# Patient Record
Sex: Male | Born: 1975
Health system: Southern US, Community
[De-identification: ages and names within clinical notes are randomized; demographics above are authoritative.]

## PROBLEM LIST (undated history)

## (undated) DIAGNOSIS — Z789 Other specified health status: Secondary | ICD-10-CM

## (undated) HISTORY — PX: CERVICAL DISC SURGERY: SHX588

## (undated) HISTORY — PX: ANKLE SURGERY: SHX546

## (undated) HISTORY — PX: MOUTH SURGERY: SHX715

---

## 2002-09-24 ENCOUNTER — Encounter: Payer: Self-pay | Admitting: Emergency Medicine

## 2002-09-24 ENCOUNTER — Emergency Department (HOSPITAL_COMMUNITY): Admission: EM | Admit: 2002-09-24 | Discharge: 2002-09-24 | Payer: Self-pay | Admitting: Emergency Medicine

## 2003-12-04 ENCOUNTER — Ambulatory Visit (HOSPITAL_COMMUNITY): Admission: RE | Admit: 2003-12-04 | Discharge: 2003-12-04 | Payer: Self-pay | Admitting: Family Medicine

## 2006-10-18 ENCOUNTER — Ambulatory Visit (HOSPITAL_COMMUNITY): Admission: RE | Admit: 2006-10-18 | Discharge: 2006-10-19 | Payer: Self-pay | Admitting: Neurosurgery

## 2008-04-12 ENCOUNTER — Emergency Department (HOSPITAL_COMMUNITY): Admission: EM | Admit: 2008-04-12 | Discharge: 2008-04-13 | Payer: Self-pay | Admitting: Emergency Medicine

## 2008-07-14 IMAGING — CR DG CERVICAL SPINE 2 OR 3 VIEWS
1 series · 1 of 1 positions shown · non-contrast
Comparison: none

CLINICAL DATA: C6-7 ACDF.  
 CERVICAL SPINE ? 2 VIEW:

[view not recorded]
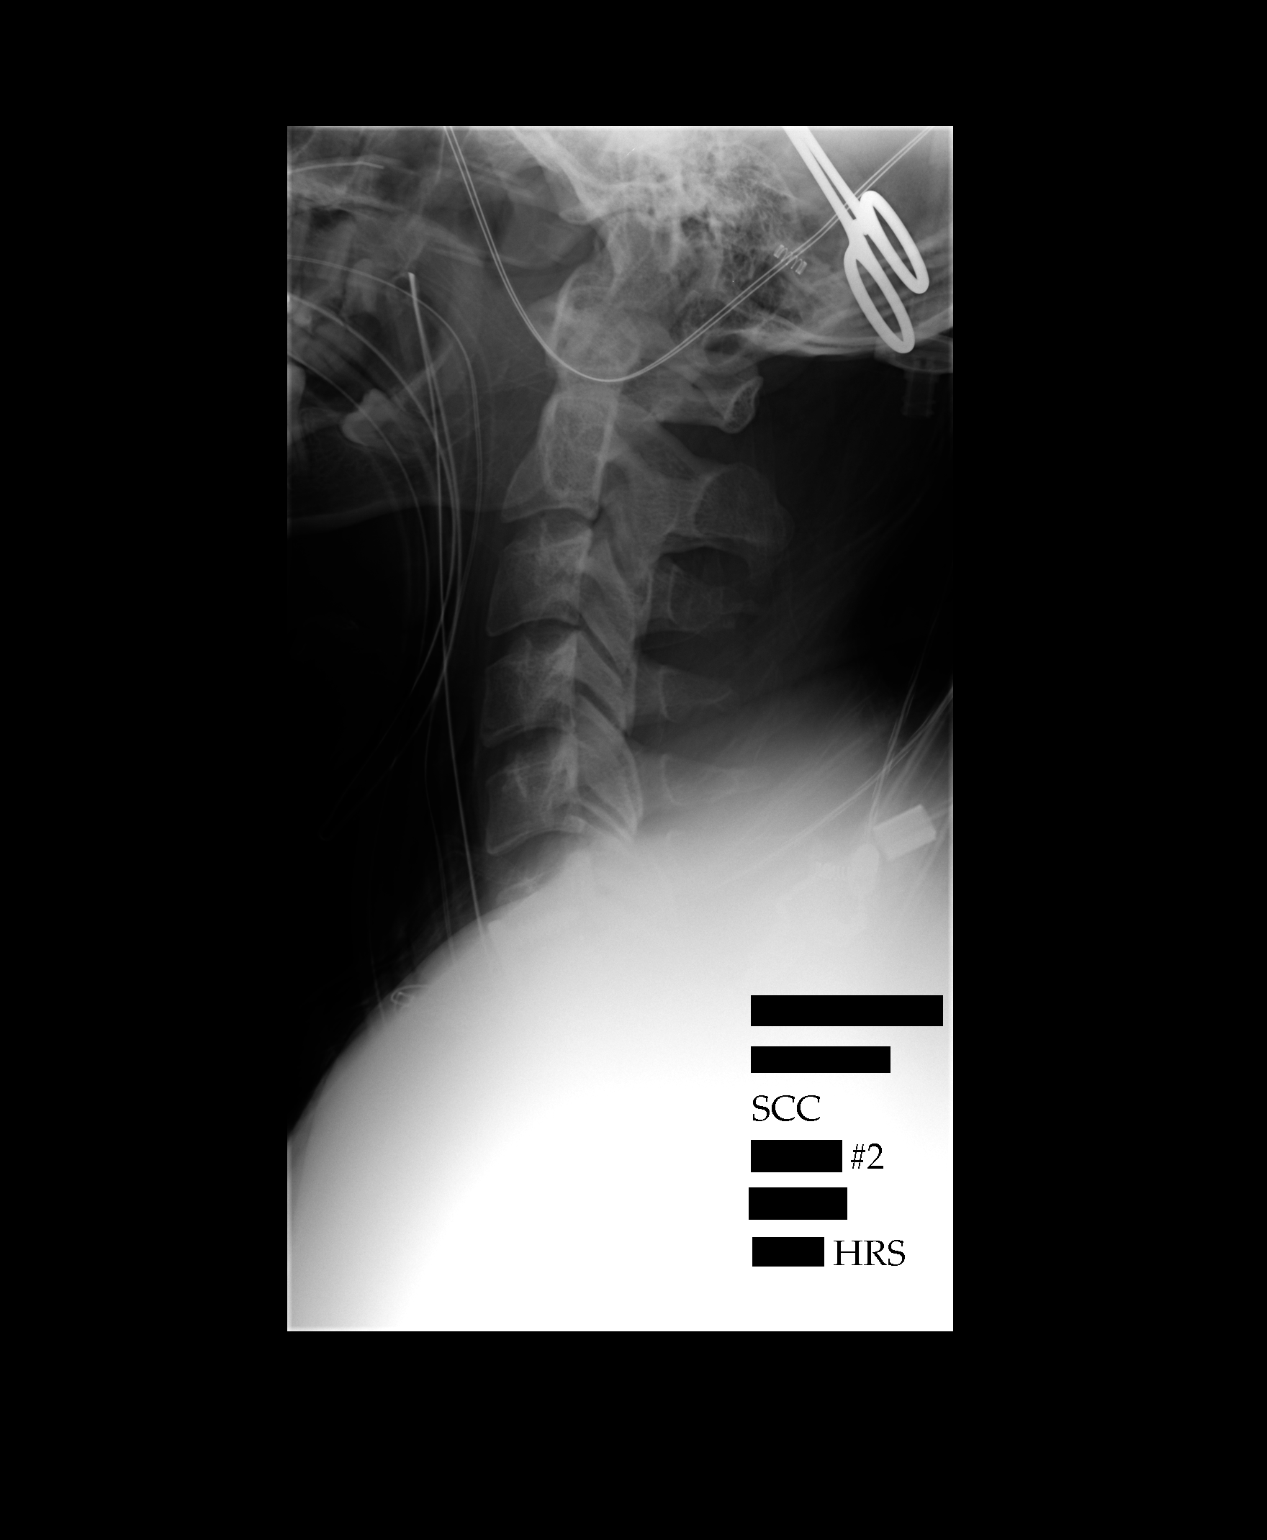

[1 of 1 positions shown; findings below may reference images not displayed]

FINDINGS: An initial film shows a needle marking the anterior disc space at C4-5.  
 A 2nd film shows anterior cervical discectomy and fusion at C6-7.  One can appreciate an anterior plate with screws in C6.  Otherwise, detail is limited by shoulder density.
IMPRESSION: As discussed above.

## 2010-09-07 NOTE — Op Note (Signed)
NAMEBELDON, NOWLING NO.:  [id number]   MEDICAL RECORD NO.:  [id number]          PATIENT TYPE:  OIB   LOCATION:  3172                         FACILITY:  [facility]   PHYSICIAN:  Cristi Loron, M.D.DATE OF BIRTH:  December 08, 1975   DATE OF PROCEDURE:  10/18/2006  DATE OF DISCHARGE:                               OPERATIVE REPORT   BRIEF HISTORY:  The patient is 35 year old white male who suffered from  neck and right arm pain consistent with a right C7 radiculopathy.  He  has failed medical management and I discussed various treatment options  with him including surgery.  The patient has weighed the risks, benefits  and alternatives of surgery and decided to proceed with C6-7 anterior  cervical diskectomy, fusion plating.   PREOPERATIVE DIAGNOSIS:  C6-7 herniated nucleus pulposus, spinal  stenosis, cervical radiculopathy, cervicalgia.   POSTOPERATIVE DIAGNOSIS:  C6-7 herniated nucleus pulposus, spinal  stenosis, cervical radiculopathy, cervicalgia.   PROCEDURE:  C6-7 extensive anterior cervical diskectomy and  decompression; C6-7 anterior interbody local autograft arthrodesis;  insertion of C6-7 interbody prosthesis (Alphatec PEEK interbody  prosthesis); anterior cervical plating C6-7 with Codman slim lock  titanium plate and screws.   SURGEON:  Dr. Delma Officer.   ASSISTANT:  Dr. Barnett Abu.   ANESTHESIA:  Endotracheal.   ESTIMATED BLOOD LOSS:  50 mL.   SPECIMENS:  None.   DRAINS:  None.   COMPLICATIONS:  None.   DESCRIPTION OF PROCEDURE:  The patient is brought to operating room by  anesthesia team.  General endotracheal anesthesia was induced.  The  patient remained in supine position.  A roll was placed under shoulders  to place the neck in slight extension.  His anterior cervical region was  then prepared with Betadine scrub and Betadine solution.  Sterile drapes  were applied.  I then injected the area to be incised with Marcaine with  epinephrine solution.  Used scalpel to make a transverse incision in the  patient's left anterior neck.  I used a Metzenbaum scissors to divide  the platysma muscle and then to dissect medial to sternocleidomastoid  muscle, jugular vein and carotid artery.  I carefully dissected down  towards the anterior cervical spine carefully identifying esophagus  retracting it medially.  I then used Kitner swabs to clear soft tissue  from the anterior cervical spine and then inserted a bent spinal needle  in the upper exposed intervertebral disk space.  We then obtained  intraoperative radiograph to confirm our location.   We then used electrocautery to detach the medial border of the longus  colli muscle bilaterally from C6-7 intervertebral disk space.  We then  inserted a Caspar self-retaining retractor for exposure.  We then  incised C6-7 intervertebral disk with 15 scalpel and then used the  pituitary forceps and Carlen curettes to perform a partial  intervertebral diskectomy.  We inserted distraction screws C6-7  distracted interspace and then I used a high-speed drill to decorticate  the vertebral endplates C6-7, drill away the remainder of C6-7  intervertebral disk, drill away some posterior spondylosis and then to  thin out  the posterior longitudinal ligament.  We then incised ligament  with arachnoid knife and removed it with Kerrison punch undercutting the  vertebral endplates C6-7 and then performed foraminotomy about the  bilateral C7 nerve root.  Of note, we did encountered expected herniated  C6-7 on the right compressing the right C7 nerve root.  At this point we  had a good decompression of thecal sac and the bilateral C7 nerve roots.   We now turned attention to arthrodesis.  We used the trial spacers and  determined to use a 8-mm medium Alphatec PEEK interbody prosthesis.  We  prefilled this prosthesis with a combination of local morselized  autograft bone we obtained during  decompression as well as Vitoss bone  graft extender.  We inserted the filled prosthesis into distracted  interspace and then removed distraction screws.  There was good snug fit  of prosthesis in the interspace.   We now turned attention anterior spinal instrumentation, used a high-  speed drill to remove some ventral spondylosis from the vertebral  endplates at C6-7 so that plate would lay down flat.  We selected  appropriate length slim lock anterior cervical plate and laid it along  the anterior aspect of vertebral bodies C6 and C7.  We then drilled two  14 mm holes C6, two at C7 and then secured the plate to the vertebral  bodies by placing two 14 mm self-tapping screws at C6, two at C7.  We  then obtained intraoperative radiograph. There was very limited  visualization of the plate and screws and interbody prosthesis because  of the patient's shoulders but they looked good in vivo.  We therefore  secured the screws and plate by locking each cam completing the  instrumentation.   We then irrigated the wound out bacitracin solution, obtained stringent  hemostasis using bipolar cautery, we inspected esophagus for any damage.  None was apparent.  We then reapproximated the patient's platysma muscle  with interrupted 3-0 Vicryl suture, subcutaneous tissue with interrupted  3-0 Vicryl suture and the skin with Steri-Strips and Benzoin.  The wound  was then coated bacitracin ointment, sterile dressing applied.  The  drapes were removed.  The patient was subsequently extubated by  anesthesia team and transported to post anesthesia care unit in stable  condition.  All sponge, instrument and needle counts correct end this  case.      Cristi Loron, M.D.  Electronically Signed     JDJ/MEDQ  D:  10/18/2006  T:  10/19/2006  Job:  [id number]

## 2011-02-09 LAB — CBC
HCT: 43.9
MCHC: 34.5
MCV: 88.7
Platelets: 245
RDW: 13

## 2011-08-19 ENCOUNTER — Other Ambulatory Visit: Payer: Self-pay | Admitting: Family Medicine

## 2011-08-19 DIAGNOSIS — M5416 Radiculopathy, lumbar region: Secondary | ICD-10-CM

## 2011-08-22 ENCOUNTER — Ambulatory Visit
Admission: RE | Admit: 2011-08-22 | Discharge: 2011-08-22 | Disposition: A | Discharge status: Home / Self Care | Payer: 59 | Source: Ambulatory Visit | Attending: Family Medicine | Admitting: Family Medicine

## 2011-08-22 DIAGNOSIS — M5416 Radiculopathy, lumbar region: Secondary | ICD-10-CM

## 2011-10-14 ENCOUNTER — Other Ambulatory Visit: Payer: Self-pay | Admitting: Neurosurgery

## 2011-10-14 ENCOUNTER — Encounter (HOSPITAL_COMMUNITY): Payer: Self-pay | Admitting: Pharmacy Technician

## 2011-10-17 NOTE — Anesthesia Preprocedure Evaluation (Addendum)
Anesthesia Evaluation  Patient identified by MRN, date of birth, ID band Patient awake    Reviewed: Allergy & Precautions, H&P , NPO status , Patient's Chart, lab work & pertinent test results  Airway Mallampati: II      Dental  (+) Teeth Intact   Pulmonary neg pulmonary ROS,  breath sounds clear to auscultation        Cardiovascular Rhythm:Regular Rate:Normal     Neuro/Psych negative neurological ROS     GI/Hepatic negative GI ROS, Neg liver ROS,   Endo/Other  negative endocrine ROS  Renal/GU negative Renal ROS     Musculoskeletal negative musculoskeletal ROS (+)   Abdominal   Peds  Hematology negative hematology ROS (+)   Anesthesia Other Findings   Reproductive/Obstetrics                          Anesthesia Physical Anesthesia Plan  ASA: II  Anesthesia Plan: General   Post-op Pain Management:    Induction: Intravenous  Airway Management Planned: Oral ETT  Additional Equipment:   Intra-op Plan:   Post-operative Plan: Extubation in OR  Informed Consent: I have reviewed the patients History and Physical, chart, labs and discussed the procedure including the risks, benefits and alternatives for the proposed anesthesia with the patient or authorized representative who has indicated his/her understanding and acceptance.   Dental advisory given  Plan Discussed with: CRNA and Anesthesiologist  Anesthesia Plan Comments:         Anesthesia Quick Evaluation

## 2011-10-18 ENCOUNTER — Encounter (HOSPITAL_COMMUNITY): Payer: Self-pay

## 2011-10-18 ENCOUNTER — Encounter (HOSPITAL_COMMUNITY)
Admission: RE | Admit: 2011-10-18 | Discharge: 2011-10-18 | Disposition: A | Discharge status: Home / Self Care | Payer: 59 | Source: Ambulatory Visit | Attending: Neurosurgery | Admitting: Neurosurgery

## 2011-10-18 HISTORY — DX: Other specified health status: Z78.9

## 2011-10-18 LAB — CBC
Hemoglobin: 15.7 g/dL (ref 13.0–17.0)
MCH: 29.4 pg (ref 26.0–34.0)
MCHC: 34.1 g/dL (ref 30.0–36.0)
MCV: 86.1 fL (ref 78.0–100.0)

## 2011-10-18 LAB — COMPREHENSIVE METABOLIC PANEL
Albumin: 4 g/dL (ref 3.5–5.2)
Alkaline Phosphatase: 67 U/L (ref 39–117)
BUN: 10 mg/dL (ref 6–23)
Chloride: 101 mEq/L (ref 96–112)
GFR calc Af Amer: >90 mL/min (ref >90)
Glucose, Bld: 94 mg/dL (ref 70–99)
Potassium: 4.2 mEq/L (ref 3.5–5.1)
Total Bilirubin: 0.4 mg/dL (ref 0.3–1.2)

## 2011-10-18 LAB — SURGICAL PCR SCREEN: MRSA, PCR: NEGATIVE

## 2011-10-18 NOTE — Pre-Procedure Instructions (Signed)
20 [name]  10/18/2011   Your procedure is scheduled on:  10/20/11  Report to [facility] at 5:30 AM (per MD office).  Call this number if you have problems the morning of surgery: [phone]   Remember: Discontinue Aspirin,Coumadin, Effient, Plavix and herbal medications.   Do not eat food or drink fluids:After Midnight.    Take these medicines the morning of surgery with A SIP OF WATER: pain pill, Valium   Do not wear jewelry, make-up or nail polish.  Do not wear lotions, powders, or perfumes. You may wear deodorant.  Do not shave 48 hours prior to surgery. Men may shave face and neck.  Do not bring valuables to the hospital.  Contacts, dentures or bridgework may not be worn into surgery.  Leave suitcase in the car. After surgery it may be brought to your room.  For patients admitted to the hospital, checkout time is 11:00 AM the day of discharge.   Patients discharged the day of surgery will not be allowed to drive home.    Special Instructions: CHG Shower Use Special Wash: 1/2 bottle night before surgery and 1/2 bottle morning of surgery. and N/A   Please read over the following fact sheets that you were given: Pain Booklet, Coughing and Deep Breathing, MRSA Information and Surgical Site Infection Prevention

## 2011-10-19 MED ORDER — CEFAZOLIN SODIUM-DEXTROSE 2-3 GM-% IV SOLR
2.0000 g | INTRAVENOUS | Status: AC
  Administered 2011-10-20: 2 g via INTRAVENOUS
  Filled 2011-10-19: qty 50

## 2011-10-20 ENCOUNTER — Encounter (HOSPITAL_COMMUNITY): Payer: Self-pay | Admitting: Anesthesiology

## 2011-10-20 ENCOUNTER — Encounter (HOSPITAL_COMMUNITY): Payer: Self-pay | Admitting: Surgery

## 2011-10-20 ENCOUNTER — Encounter (HOSPITAL_COMMUNITY)
Admission: RE | Disposition: A | Discharge status: Home / Self Care | Payer: Self-pay | Source: Ambulatory Visit | Attending: Neurosurgery

## 2011-10-20 ENCOUNTER — Ambulatory Visit (HOSPITAL_COMMUNITY): Payer: 59 | Admitting: Anesthesiology

## 2011-10-20 ENCOUNTER — Ambulatory Visit (HOSPITAL_COMMUNITY)
Admission: RE | Admit: 2011-10-20 | Discharge: 2011-10-20 | Disposition: A | Discharge status: Home / Self Care | Payer: 59 | Source: Ambulatory Visit | Attending: Neurosurgery | Admitting: Neurosurgery

## 2011-10-20 ENCOUNTER — Ambulatory Visit (HOSPITAL_COMMUNITY): Payer: 59

## 2011-10-20 DIAGNOSIS — M5126 Other intervertebral disc displacement, lumbar region: Secondary | ICD-10-CM

## 2011-10-20 DIAGNOSIS — Z01812 Encounter for preprocedural laboratory examination: Secondary | ICD-10-CM | POA: Insufficient documentation

## 2011-10-20 HISTORY — PX: LUMBAR LAMINECTOMY/DECOMPRESSION MICRODISCECTOMY: SHX5026

## 2011-10-20 SURGERY — LUMBAR LAMINECTOMY/DECOMPRESSION MICRODISCECTOMY 1 LEVEL
Anesthesia: General | Laterality: Left | Wound class: Clean

## 2011-10-20 MED ORDER — DIAZEPAM 5 MG PO TABS
5.0000 mg | ORAL_TABLET | Freq: Four times a day (QID) | ORAL | Status: DC | PRN
  Administered 2011-10-20: 5 mg via ORAL
  Filled 2011-10-20: qty 1

## 2011-10-20 MED ORDER — ADULT MULTIVITAMIN W/MINERALS CH: 1.0000 | ORAL_TABLET | Freq: Every day | ORAL | Status: DC

## 2011-10-20 MED ORDER — MIDAZOLAM HCL 5 MG/5ML IJ SOLN
INTRAMUSCULAR | Status: DC | PRN
  Administered 2011-10-20: 2 mg via INTRAVENOUS

## 2011-10-20 MED ORDER — HYDROMORPHONE HCL PF 1 MG/ML IJ SOLN
0.2500 mg | INTRAMUSCULAR | Status: DC | PRN
  Administered 2011-10-20: 0.5 mg via INTRAVENOUS

## 2011-10-20 MED ORDER — ROCURONIUM BROMIDE 100 MG/10ML IV SOLN
INTRAVENOUS | Status: DC | PRN
  Administered 2011-10-20: 50 mg via INTRAVENOUS

## 2011-10-20 MED ORDER — BUPIVACAINE-EPINEPHRINE PF 0.5-1:200000 % IJ SOLN
INTRAMUSCULAR | Status: DC | PRN
  Administered 2011-10-20: 10 mL

## 2011-10-20 MED ORDER — ACETAMINOPHEN 325 MG PO TABS: 650.0000 mg | ORAL_TABLET | ORAL | Status: DC | PRN

## 2011-10-20 MED ORDER — DOCUSATE SODIUM 100 MG PO CAPS: 100.0000 mg | ORAL_CAPSULE | Freq: Two times a day (BID) | ORAL | Status: DC

## 2011-10-20 MED ORDER — GLYCOPYRROLATE 0.2 MG/ML IJ SOLN
INTRAMUSCULAR | Status: DC | PRN
  Administered 2011-10-20: .4 mg via INTRAVENOUS

## 2011-10-20 MED ORDER — MORPHINE SULFATE 2 MG/ML IJ SOLN: 1.0000 mg | INTRAMUSCULAR | Status: DC | PRN

## 2011-10-20 MED ORDER — 0.9 % SODIUM CHLORIDE (POUR BTL) OPTIME
TOPICAL | Status: DC | PRN
  Administered 2011-10-20: 1000 mL

## 2011-10-20 MED ORDER — DIAZEPAM 5 MG PO TABS: 5.0000 mg | ORAL_TABLET | Freq: Four times a day (QID) | ORAL | Status: AC | PRN

## 2011-10-20 MED ORDER — LACTATED RINGERS IV SOLN: INTRAVENOUS | Status: DC

## 2011-10-20 MED ORDER — SODIUM CHLORIDE 0.9 % IR SOLN
Status: DC | PRN
  Administered 2011-10-20: 09:00:00

## 2011-10-20 MED ORDER — ONDANSETRON HCL 4 MG/2ML IJ SOLN: 4.0000 mg | INTRAMUSCULAR | Status: DC | PRN

## 2011-10-20 MED ORDER — BACITRACIN ZINC 500 UNIT/GM EX OINT
TOPICAL_OINTMENT | CUTANEOUS | Status: DC | PRN
  Administered 2011-10-20: 1 via TOPICAL

## 2011-10-20 MED ORDER — DEXAMETHASONE SODIUM PHOSPHATE 4 MG/ML IJ SOLN
INTRAMUSCULAR | Status: DC | PRN
  Administered 2011-10-20: 10 mg via INTRAVENOUS

## 2011-10-20 MED ORDER — LACTATED RINGERS IV SOLN
INTRAVENOUS | Status: DC | PRN
  Administered 2011-10-20 (×2): via INTRAVENOUS

## 2011-10-20 MED ORDER — BACITRACIN 50000 UNITS IM SOLR
INTRAMUSCULAR | Status: AC
  Filled 2011-10-20: qty 1

## 2011-10-20 MED ORDER — SODIUM CHLORIDE 0.9 % IV SOLN
INTRAVENOUS | Status: AC
  Filled 2011-10-20: qty 500

## 2011-10-20 MED ORDER — ONDANSETRON HCL 4 MG/2ML IJ SOLN
INTRAMUSCULAR | Status: DC | PRN
  Administered 2011-10-20: 4 mg via INTRAVENOUS

## 2011-10-20 MED ORDER — PROPOFOL 10 MG/ML IV BOLUS
INTRAVENOUS | Status: DC | PRN
  Administered 2011-10-20: 200 mg via INTRAVENOUS

## 2011-10-20 MED ORDER — HYDROMORPHONE HCL PF 1 MG/ML IJ SOLN
INTRAMUSCULAR | Status: AC
  Administered 2011-10-20: 0.5 mg via INTRAVENOUS
  Filled 2011-10-20: qty 1

## 2011-10-20 MED ORDER — OXYCODONE-ACETAMINOPHEN 5-325 MG PO TABS: 1.0000 | ORAL_TABLET | ORAL | Status: AC | PRN

## 2011-10-20 MED ORDER — MENTHOL 3 MG MT LOZG: 1.0000 | LOZENGE | OROMUCOSAL | Status: DC | PRN

## 2011-10-20 MED ORDER — ZOLPIDEM TARTRATE 5 MG PO TABS: 10.0000 mg | ORAL_TABLET | Freq: Every evening | ORAL | Status: DC | PRN

## 2011-10-20 MED ORDER — CEFAZOLIN SODIUM-DEXTROSE 2-3 GM-% IV SOLR
2.0000 g | Freq: Three times a day (TID) | INTRAVENOUS | Status: DC
  Administered 2011-10-20: 2 g via INTRAVENOUS
  Filled 2011-10-20 (×2): qty 50

## 2011-10-20 MED ORDER — NEOSTIGMINE METHYLSULFATE 1 MG/ML IJ SOLN
INTRAMUSCULAR | Status: DC | PRN
  Administered 2011-10-20: 3 mg via INTRAVENOUS

## 2011-10-20 MED ORDER — LIDOCAINE HCL (CARDIAC) 20 MG/ML IV SOLN
INTRAVENOUS | Status: DC | PRN
  Administered 2011-10-20: 100 mg via INTRAVENOUS

## 2011-10-20 MED ORDER — HEMOSTATIC AGENTS (NO CHARGE) OPTIME
TOPICAL | Status: DC | PRN
  Administered 2011-10-20: 1 via TOPICAL

## 2011-10-20 MED ORDER — ACETAMINOPHEN 650 MG RE SUPP: 650.0000 mg | RECTAL | Status: DC | PRN

## 2011-10-20 MED ORDER — PHENOL 1.4 % MT LIQD: 1.0000 | OROMUCOSAL | Status: DC | PRN

## 2011-10-20 MED ORDER — DSS 100 MG PO CAPS: 100.0000 mg | ORAL_CAPSULE | Freq: Two times a day (BID) | ORAL | Status: AC

## 2011-10-20 MED ORDER — FENTANYL CITRATE 0.05 MG/ML IJ SOLN
INTRAMUSCULAR | Status: DC | PRN
  Administered 2011-10-20: 150 ug via INTRAVENOUS

## 2011-10-20 MED ORDER — HYDROMORPHONE HCL PF 1 MG/ML IJ SOLN: 0.2500 mg | INTRAMUSCULAR | Status: DC | PRN

## 2011-10-20 MED ORDER — OXYCODONE-ACETAMINOPHEN 5-325 MG PO TABS
1.0000 | ORAL_TABLET | ORAL | Status: DC | PRN
  Administered 2011-10-20 (×2): 2 via ORAL
  Filled 2011-10-20 (×2): qty 2

## 2011-10-20 MED ORDER — HYDROCODONE-ACETAMINOPHEN 5-325 MG PO TABS: 1.0000 | ORAL_TABLET | ORAL | Status: DC | PRN

## 2011-10-20 MED ORDER — THROMBIN 5000 UNITS EX SOLR
CUTANEOUS | Status: DC | PRN
  Administered 2011-10-20 (×2): 5000 [IU] via TOPICAL

## 2011-10-20 SURGICAL SUPPLY — 53 items
APL SKNCLS STERI-STRIP NONHPOA (GAUZE/BANDAGES/DRESSINGS) ×1
BAG DECANTER FOR FLEXI CONT (MISCELLANEOUS) ×2 IMPLANT
BENZOIN TINCTURE PRP APPL 2/3 (GAUZE/BANDAGES/DRESSINGS) ×2 IMPLANT
BLADE SURG ROTATE 9660 (MISCELLANEOUS) IMPLANT
BRUSH SCRUB EZ PLAIN DRY (MISCELLANEOUS) ×2 IMPLANT
BUR ACORN 6.0 (BURR) ×2 IMPLANT
BUR MATCHSTICK NEURO 3.0 LAGG (BURR) ×2 IMPLANT
CANISTER SUCTION 2500CC (MISCELLANEOUS) ×2 IMPLANT
CLOTH BEACON ORANGE TIMEOUT ST (SAFETY) ×2 IMPLANT
CONT SPEC 4OZ CLIKSEAL STRL BL (MISCELLANEOUS) ×2 IMPLANT
DRAPE LAPAROTOMY 100X72X124 (DRAPES) ×2 IMPLANT
DRAPE MICROSCOPE LEICA (MISCELLANEOUS) ×2 IMPLANT
DRAPE POUCH INSTRU U-SHP 10X18 (DRAPES) ×2 IMPLANT
DRAPE SURG 17X23 STRL (DRAPES) ×8 IMPLANT
ELECT BLADE 4.0 EZ CLEAN MEGAD (MISCELLANEOUS) ×2
ELECT REM PT RETURN 9FT ADLT (ELECTROSURGICAL) ×2
ELECTRODE BLDE 4.0 EZ CLN MEGD (MISCELLANEOUS) ×1 IMPLANT
ELECTRODE REM PT RTRN 9FT ADLT (ELECTROSURGICAL) ×1 IMPLANT
GAUZE SPONGE 4X4 16PLY XRAY LF (GAUZE/BANDAGES/DRESSINGS) IMPLANT
GLOVE BIO SURGEON STRL SZ8.5 (GLOVE) ×2 IMPLANT
GLOVE BIOGEL PI IND STRL 6.5 (GLOVE) IMPLANT
GLOVE BIOGEL PI INDICATOR 6.5 (GLOVE) ×3
GLOVE EXAM NITRILE LRG STRL (GLOVE) IMPLANT
GLOVE EXAM NITRILE MD LF STRL (GLOVE) ×1 IMPLANT
GLOVE EXAM NITRILE XL STR (GLOVE) IMPLANT
GLOVE EXAM NITRILE XS STR PU (GLOVE) IMPLANT
GLOVE INDICATOR 7.5 STRL GRN (GLOVE) ×2 IMPLANT
GLOVE SS BIOGEL STRL SZ 8 (GLOVE) ×1 IMPLANT
GLOVE SUPERSENSE BIOGEL SZ 8 (GLOVE) ×1
GOWN BRE IMP SLV AUR LG STRL (GOWN DISPOSABLE) IMPLANT
GOWN BRE IMP SLV AUR XL STRL (GOWN DISPOSABLE) ×3 IMPLANT
GOWN STRL REIN 2XL LVL4 (GOWN DISPOSABLE) IMPLANT
KIT BASIN OR (CUSTOM PROCEDURE TRAY) ×2 IMPLANT
KIT ROOM TURNOVER OR (KITS) ×2 IMPLANT
NDL HYPO 21X1.5 SAFETY (NEEDLE) IMPLANT
NEEDLE HYPO 21X1.5 SAFETY (NEEDLE) IMPLANT
NEEDLE HYPO 22GX1.5 SAFETY (NEEDLE) ×2 IMPLANT
NS IRRIG 1000ML POUR BTL (IV SOLUTION) ×2 IMPLANT
PACK LAMINECTOMY NEURO (CUSTOM PROCEDURE TRAY) ×2 IMPLANT
PAD ARMBOARD 7.5X6 YLW CONV (MISCELLANEOUS) ×6 IMPLANT
PATTIES SURGICAL .5 X1 (DISPOSABLE) IMPLANT
RUBBERBAND STERILE (MISCELLANEOUS) ×4 IMPLANT
SPONGE GAUZE 4X4 12PLY (GAUZE/BANDAGES/DRESSINGS) ×2 IMPLANT
SPONGE SURGIFOAM ABS GEL SZ50 (HEMOSTASIS) ×2 IMPLANT
STRIP CLOSURE SKIN 1/2X4 (GAUZE/BANDAGES/DRESSINGS) ×2 IMPLANT
SUT VIC AB 1 CT1 18XBRD ANBCTR (SUTURE) ×1 IMPLANT
SUT VIC AB 1 CT1 8-18 (SUTURE) ×2
SUT VIC AB 2-0 CP2 18 (SUTURE) ×2 IMPLANT
SYR 20CC LL (SYRINGE) IMPLANT
SYR 20ML ECCENTRIC (SYRINGE) ×2 IMPLANT
TOWEL OR 17X24 6PK STRL BLUE (TOWEL DISPOSABLE) ×2 IMPLANT
TOWEL OR 17X26 10 PK STRL BLUE (TOWEL DISPOSABLE) ×2 IMPLANT
WATER STERILE IRR 1000ML POUR (IV SOLUTION) ×2 IMPLANT

## 2011-10-20 NOTE — Transfer of Care (Signed)
Immediate Anesthesia Transfer of Care Note  Patient: Shane Hooper  Procedure(s) Performed: Procedure(s) (LRB): LUMBAR LAMINECTOMY/DECOMPRESSION MICRODISCECTOMY 1 LEVEL (Left)  Patient Location: PACU  Anesthesia Type: General  Level of Consciousness: awake  Airway & Oxygen Therapy: Patient Spontanous Breathing  Post-op Assessment: Report given to PACU RN  Post vital signs: Reviewed  Complications: No apparent anesthesia complications

## 2011-10-20 NOTE — Op Note (Signed)
Brief history: The patient is a 36 year old white male who has suffered from back and left leg pain consistent with a lumbar radiculopathy. He has failed medical management and was worked up with a lumbar MRI. This demonstrated a herniated disc at L3-4 the left. I discussed the various treatment options with the patient including surgery. He has weighed the risks, benefits, and alternatives surgery decided proceed with the L3-4 discectomy.  Preoperative diagnosis: Left L3-4 far lateral herniated disc, spinal stenosis, lumbar radiculopathy, lumbago  Postoperative diagnosis: The same  Procedure: Left L3-4 or lateral Intervertebral discectomy using micro-dissection  Surgeon: Dr. Delma Officer  Asst.: Dr. Maeola Harman  Anesthesia: Gen. endotracheal  Estimated blood loss: 50 cc  Drains: None  Complications: None  Description of procedure: The patient was brought to the operating room by the anesthesia team. General endotracheal anesthesia was induced. The patient was turned to the prone position on the Wilson frame. The patient's lumbosacral region was then prepared with Betadine scrub and Betadine solution. Sterile drapes were applied.  I then injected the area to be incised with Marcaine with epinephrine solution. I then used a scalpel to make a linear midline incision over the L3-4 intervertebral disc space. I then used electrocautery to perform a left sided subperiosteal dissection exposing the spinous process and lamina of L3 and L4. We obtained intraoperative radiograph to confirm our location. I then inserted the [facility] retractor for exposure.  We then brought the operative microscope into the field. Under its magnification and illumination we completed the microdissection. I used a high-speed drill to drill away the lateral aspect of the left L3 pars.. I then used a Kerrison punches to remove the intertransverse ligament at L3-4 on the left.. We then used microdissection to free up the  exiting L3 nerve root from the epidural tissue. I then used a Kerrison punch to perform a foraminotomy at about the 3 nerve root. We then dissected caudal to the L3 nerve root and identified the herniated disc. We removed this with the micropituitary forceps..  I then palpated along the ventral surface of the L3 nerve root and noted that the neural structures were well decompressed. This completed the decompression.  We then obtained hemostasis using bipolar electrocautery. We irrigated the wound out with bacitracin solution. We then removed the retractor. We then reapproximated the patient's thoracolumbar fascia with interrupted #1 Vicryl suture. We then reapproximated the patient's subcutaneous tissue with interrupted 3-0 Vicryl suture. We then reapproximated patient's skin with Steri-Strips and benzoin. The was then coated with bacitracin ointment. The drapes were removed. The patient was subsequently returned to the supine position where they were extubated by the anesthesia team. The patient was then transported to the postanesthesia care unit in stable condition. All sponge instrument and needle counts were correct at the end of this case.

## 2011-10-20 NOTE — Progress Notes (Signed)
Patient ID: Shane Hooper, male   DOB: 1975-10-05, 36 y.o.   MRN: [id number] Subjective:  The patient is alert and pleasant. He looks well. He denies pain.  Objective: Vital signs in last 24 hours: Temp:  [97.6 F (36.4 C)-97.9 F (36.6 C)] 97.6 F (36.4 C) (06/27 1050) Pulse Rate:  [73-79] 73  (06/27 1110) Resp:  [17-19] 17  (06/27 1110) BP: (117-129)/(77-84) 129/80 mmHg (06/27 1110) SpO2:  [93 %-96 %] 93 % (06/27 1110)  Intake/Output from previous day:   Intake/Output this shift: Total I/O In: 1300 [I.V.:1300] Out: 15 [Blood:15]  Physical exam patient is alert and oriented. He is moving all 4 extremities well.  Lab Results:  Basename 10/18/11 1224  WBC 11.6*  HGB 15.7  HCT 46.0  PLT 264   BMET  Basename 10/18/11 1224  NA 137  K 4.2  CL 101  CO2 25  GLUCOSE 94  BUN 10  CREATININE 0.97  CALCIUM 9.6    Studies/Results: No results found.  Assessment/Plan: The patient is doing well.  LOS: 0 days     Bill Yohn D 10/20/2011, 11:17 AM

## 2011-10-20 NOTE — Discharge Summary (Signed)
  Physician Discharge Summary  Patient ID: Shane Hooper MRN: [id number] DOB/AGE: Aug 08, 1975 35 y.o.  Admit date: 10/20/2011 Discharge date: 10/20/2011  Admission Diagnoses: Left L3-4 far lateral herniated disc, lumbar radiculopathy, lumbago  Discharge Diagnoses: The same Principal Problem:  *Lumbar herniated disc   Discharged Condition: good  Hospital Course: I admitted the patient to [facility] on 10/20/2011. On that day I performed a left L3-4 far lateral discectomy. The surgery went well. The patient's postoperative course was unremarkable and he requested discharge to home that afternoon. The patient was given oral and written discharge instructions. All his questions were answered.  Consults: None Significant Diagnostic Studies: None Treatments: Left L3-4 far lateral discectomy using microdissection Discharge Exam: Blood pressure 131/88, pulse 69, temperature 98.3 F (36.8 C), temperature source Oral, resp. rate 16, SpO2 93.00%. The patient is alert and pleasant. He looks well. His strength is normal his lower extremities.  Disposition: Home  Discharge Orders    Future Orders Please Complete By Expires   Diet - low sodium heart healthy      Increase activity slowly      Discharge instructions      Comments:   Call [phone] for a followup appointment.    Remove dressing in 72 hours      Call MD for:  temperature >100.4      Call MD for:  persistant nausea and vomiting      Call MD for:  severe uncontrolled pain      Call MD for:  redness, tenderness, or signs of infection (pain, swelling, redness, odor or green/yellow discharge around incision site)      Call MD for:  difficulty breathing, headache or visual disturbances      Call MD for:  hives      Call MD for:  persistant dizziness or light-headedness      Call MD for:  extreme fatigue        Medication List  As of 10/20/2011  2:52 PM   STOP taking these medications         HYDROcodone-acetaminophen  7.5-750 MG per tablet         TAKE these medications         diazepam 5 MG tablet   Commonly known as: VALIUM   Take 2.5-5 mg by mouth at bedtime as needed. For muscle spasms      diazepam 5 MG tablet   Commonly known as: VALIUM   Take 1 tablet (5 mg total) by mouth every 6 (six) hours as needed.      DSS 100 MG Caps   Take 100 mg by mouth 2 (two) times daily.      multivitamin with minerals Tabs   Take 1 tablet by mouth daily.      oxyCODONE-acetaminophen 5-325 MG per tablet   Commonly known as: PERCOCET   Take 1-2 tablets by mouth every 4 (four) hours as needed.             SignedCristi Loron 10/20/2011, 2:52 PM

## 2011-10-20 NOTE — Preoperative (Signed)
Beta Blockers   Reason not to administer Beta Blockers:Not Applicable 

## 2011-10-20 NOTE — Transfer of Care (Signed)
Immediate Anesthesia Transfer of Care Note  Patient: Shane Hooper  Procedure(s) Performed: Procedure(s) (LRB): LUMBAR LAMINECTOMY/DECOMPRESSION MICRODISCECTOMY 1 LEVEL (Left)  Patient Location: PACU  Anesthesia Type: General  Level of Consciousness: awake, alert  and oriented  Airway & Oxygen Therapy: Patient Spontanous Breathing and Patient connected to nasal cannula oxygen  Post-op Assessment: Report given to PACU RN, Post -op Vital signs reviewed and stable and Patient moving all extremities X 4  Post vital signs: Reviewed and stable  Complications: No apparent anesthesia complications

## 2011-10-20 NOTE — H&P (Signed)
Subjective: The patient is a 36 year old white male who has suffered from back and left leg pain. He has failed medical management and was worked up with a lumbar MRI. As demonstrated a herniated disc at L3-4 on the left. I discussed the various treatment options with the patient including surgery. The patient has weighed the risks, benefits, and alternatives surgery decided proceed with a left L3-4 discectomy.    Past Medical History  Diagnosis Date  . No pertinent past medical history     Past Surgical History  Procedure Date  . Cervical disc surgery     2008  . Ankle surgery     for break-steel plate put in  . Mouth surgery     No Known Allergies  History  Substance Use Topics  . Smoking status: Former Smoker -- 1.0 packs/day for 10 years    Types: Cigarettes    Quit date: 10/18/2003  . Smokeless tobacco: Current User    Types: Chew  . Alcohol Use: Yes     daily beer2     History reviewed. No pertinent family history. Prior to Admission medications   Medication Sig Start Date End Date Taking? Authorizing Provider  diazepam (VALIUM) 5 MG tablet Take 2.5-5 mg by mouth at bedtime as needed. For muscle spasms   Yes Historical Provider, MD  HYDROcodone-acetaminophen (VICODIN ES) 7.5-750 MG per tablet Take 1 tablet by mouth every 6 (six) hours as needed. For pain   Yes Historical Provider, MD  Multiple Vitamin (MULTIVITAMIN WITH MINERALS) TABS Take 1 tablet by mouth daily.    Historical Provider, MD     Review of Systems  Positive ROS: As above  All other systems have been reviewed and were otherwise negative with the exception of those mentioned in the HPI and as above.  Objective: Vital signs in last 24 hours: Temp:  [97.9 F (36.6 C)] 97.9 F (36.6 C) (06/27 0641) Pulse Rate:  [75] 75  (06/27 0641) Resp:  [18] 18  (06/27 0641) BP: (128)/(84) 128/84 mmHg (06/27 0641) SpO2:  [96 %] 96 % (06/27 0641)  General Appearance: Alert, cooperative, no distress, appears stated  age Head: Normocephalic, without obvious abnormality, atraumatic Eyes: PERRL, conjunctiva/corneas clear, EOM's intact, fundi benign, both eyes      Ears: Normal TM's and external ear canals, both ears Throat: Lips, mucosa, and tongue normal; teeth and gums normal Neck: Supple, symmetrical, trachea midline, no adenopathy; thyroid: No enlargement/tenderness/nodules; no carotid bruit or JVD Back: Symmetric, no curvature, ROM normal, no CVA tenderness Lungs: Clear to auscultation bilaterally, respirations unlabored Heart: Regular rate and rhythm, S1 and S2 normal, no murmur, rub or gallop Abdomen: Soft, non-tender, bowel sounds active all four quadrants, no masses, no organomegaly Extremities: Extremities normal, atraumatic, no cyanosis or edema Pulses: 2+ and symmetric all extremities Skin: Skin color, texture, turgor normal, no rashes or lesions  NEUROLOGIC:   Mental status: alert and oriented, no aphasia, good attention span, Fund of knowledge/ memory ok Motor Exam - grossly normal except he has weakness in his left psoas Sensory Exam - grossly normal Reflexes: Symmetric Coordination - grossly normal Gait - grossly normal Balance - grossly normal Cranial Nerves: I: smell Not tested  II: visual acuity  OS: Normal    OD: Normal   II: visual fields Full to confrontation  II: pupils Equal, round, reactive to light  III,VII: ptosis None  III,IV,VI: extraocular muscles  Full ROM  V: mastication Normal  V: facial light touch sensation  Normal  V,VII: corneal reflex  Present  VII: facial muscle function - upper  Normal  VII: facial muscle function - lower Normal  VIII: hearing Not tested  IX: soft palate elevation  Normal  IX,X: gag reflex Present  XI: trapezius strength  5/5  XI: sternocleidomastoid strength 5/5  XI: neck flexion strength  5/5  XII: tongue strength  Normal    Data Review Lab Results  Component Value Date   WBC 11.6* 10/18/2011   HGB 15.7 10/18/2011   HCT 46.0  10/18/2011   MCV 86.1 10/18/2011   PLT 264 10/18/2011   Lab Results  Component Value Date   NA 137 10/18/2011   K 4.2 10/18/2011   CL 101 10/18/2011   CO2 25 10/18/2011   BUN 10 10/18/2011   CREATININE 0.97 10/18/2011   GLUCOSE 94 10/18/2011   No results found for this basename: INR, PROTIME    Assessment/Plan: Left L3-4 herniated disc, lumbar of this, lumbago: I discussed situation with the patient. I reviewed his MR scan with them and pointed out the abnormalities. We have discussed the various treatment options including surgery. I described the surgical option of a left L3-4 discectomy. I've shown him surgical models. We have discussed the risks, benefits, alternatives, and likelihood of achieving our goals with surgery. I've answered all the patient's questions. The patient has decided proceed with the operation.   Adellyn Capek D 10/20/2011 8:53 AM

## 2011-10-20 NOTE — Discharge Instructions (Signed)
Herniated Disk The bones of your spinal column (vertebrae) protect your spinal cord and nerves that go into your arms and legs. The vertebrae are separated by disks that cushion the spinal column and put space between your vertebrae. This allows movement between the vertebrae, which allows you to bend, rotate, and move your body from side to side. Sometimes, the disks move out of place (herniate) or break open (rupture) from injury or strain. The most common area for a disk herniation is in the lower back (lumbar area). Sometimes herniation occurs in the neck (cervical) disks.  CAUSES  As we grow older, the strong, fibrous cords that connect the vertebrae and support and surround the disks (ligaments) start to weaken. A strain on the back may cause a break in the disk ligaments. RISK FACTORS Herniated disks occur most often in men who are aged 18 years to 35 years, usually after strenuous activity. Other risk factors include conditions present at birth (congenital) that affect the size of the lumbar spinal canal. Additionally, a narrowing of the areas where the nerves exit the spinal canal can occur as you age. SYMPTOMS  Symptoms of a herniated disk vary. You may have weakness in certain muscles. This weakness can include difficulty lifting your leg or arm, difficulty standing on your toes on one side, or difficulty squeezing tightly with one of your hands. You may have numbness. You may feel a mild tingling, dull ache, or a burning or pulsating pain. In some cases, the pain is severe enough that you are unable to move. The pain most often occurs on one side of the body. The pain often starts slowly. It may get worse:  After you sit or stand.   At night.   When you sneeze, cough, or laugh.   When you bend backwards or walk more than a few yards.  The pain, numbness, or weakness will often go away or improve a lot over a period of weeks to months. Herniated lumbar disk Symptoms of a herniated  lumbar disk may include sharp pain in one part of your leg, hip, or buttocks and numbness in other parts. You also may feel pain or numbness on the back of your calf or the top or sole of your foot. The same leg also may feel weak. Herniated cervical disk Symptoms of a herniated cervical disk may include pain when you move your neck, deep pain near or over your shoulder blade, or pain that moves to your upper arm, forearm, or fingers. DIAGNOSIS  To diagnose a herniated disk, your caregiver will perform a physical exam. Your caregiver also may perform diagnostic tests to see your disk or to test the reaction of your muscles and the function of your nerves. During the physical exam, your caregiver may ask you to:  Sit, stand, and walk. While you walk, your caregiver may ask you to try walking on your toes and then your heels.   Bend forward, backward, and sideways.   Raise your shoulders, elbow, wrist, and fingers and check your strength during these tasks.  Your caregiver will check for:  Numbness or loss of feeling.   Muscle reflexes, which may be slower or missing.   Muscle strength, which may be weaker.   Posture or the way your spine curves.  Diagnostic tests that may be done include:  A spinal X-ray exam to rule out other causes of back pain.   Magnetic resonance imaging (MRI) or computed tomography (CT) scan, which will show   if the herniated disk is pressing on your spinal canal.   Electromyography. This is sometimes used to identify the specific area of nerve involvement.  TREATMENT  Initial treatment for a herniated disk is a short period of rest with medicines for pain. Pain medicines can include nonsteroidal anti-inflammatory medicines (NSAIDs), muscle relaxants for back spasms, and (rarely) narcotic pain medicine for severe pain that does not respond to NSAID use. Bed rest is often limited to 1 or 2 days at the most because prolonged rest can delay recovery. When the herniation  involves the lower back, sitting should be avoided as much as possible because sitting increases pressure on the ruptured disk. Sometimes a soft neck collar will be prescribed for a few days to weeks to help support your neck in the case of a cervical herniation. Physical therapy is often prescribed for patients with disk disease. Physical therapists will teach you how to properly lift, dress, walk, and perform other activities. They will work on strengthening the muscles that help support your spine. In some cases, physical therapy alone is not enough to treat a herniated disk. Steroid injections along the involved nerve root may be needed to help control pain. The steroid is injected in the area of the herniated disk and helps by reducing swelling around the disk. Sometimes surgery is the best option to treat a herniated disk.  SEEK IMMEDIATE MEDICAL CARE IF:   You have numbness, tingling, weakness, or problems with the use of your arms or legs.   You have severe headaches that are not relieved with the use of medicines.   You notice a change in your bowel or bladder control.   You have increasing pain in any areas of your body.   You experience shortness of breath, dizziness, or fainting.  MAKE SURE YOU:   Understand these instructions.   Will watch your condition.   Will get help right away if you are not doing well or get worse.  Document Released: 04/08/2000 Document Revised: 03/31/2011 Document Reviewed: 11/12/2010 [employer] Patient Information 2012 [employer]. 

## 2011-10-20 NOTE — Anesthesia Procedure Notes (Signed)
Procedure Name: Intubation Date/Time: 10/20/2011 9:05 AM Performed by: Marena Chancy Pre-anesthesia Checklist: Patient identified, Timeout performed, Emergency Drugs available, Suction available and Patient being monitored Patient Re-evaluated:Patient Re-evaluated prior to inductionOxygen Delivery Method: Circle system utilized Preoxygenation: Pre-oxygenation with 100% oxygen Intubation Type: IV induction Ventilation: Mask ventilation without difficulty and Oral airway inserted - appropriate to patient size Laryngoscope Size: Hyacinth Meeker and 2 Grade View: Grade II Tube type: Oral Tube size: 7.5 mm Number of attempts: 1 Placement Confirmation: ETT inserted through vocal cords under direct vision,  breath sounds checked- equal and bilateral and positive ETCO2 Secured at: 23 cm Tube secured with: Tape Dental Injury: Teeth and Oropharynx as per pre-operative assessment

## 2011-10-20 NOTE — Anesthesia Postprocedure Evaluation (Signed)
  Anesthesia Post-op Note  Patient: Shane Hooper  Procedure(s) Performed: Procedure(s) (LRB): LUMBAR LAMINECTOMY/DECOMPRESSION MICRODISCECTOMY 1 LEVEL (Left)  Patient Location: PACU  Anesthesia Type: General  Level of Consciousness: awake  Airway and Oxygen Therapy: Patient Spontanous Breathing  Post-op Pain: mild  Post-op Assessment: Post-op Vital signs reviewed  Post-op Vital Signs: Reviewed  Complications: No apparent anesthesia complications

## 2011-10-21 ENCOUNTER — Encounter (HOSPITAL_COMMUNITY): Payer: Self-pay | Admitting: Neurosurgery

## 2013-09-20 ENCOUNTER — Encounter (INDEPENDENT_AMBULATORY_CARE_PROVIDER_SITE_OTHER): Payer: Self-pay

## 2013-09-20 ENCOUNTER — Ambulatory Visit (HOSPITAL_COMMUNITY)
Admission: RE | Admit: 2013-09-20 | Discharge: 2013-09-20 | Disposition: A | Discharge status: Home / Self Care | Payer: 59 | Source: Ambulatory Visit | Attending: Physician Assistant | Admitting: Physician Assistant

## 2013-09-20 ENCOUNTER — Other Ambulatory Visit (HOSPITAL_COMMUNITY): Payer: Self-pay | Admitting: Physician Assistant

## 2013-09-20 DIAGNOSIS — R51 Headache: Secondary | ICD-10-CM

## 2013-09-20 DIAGNOSIS — R079 Chest pain, unspecified: Secondary | ICD-10-CM

## 2013-09-20 DIAGNOSIS — R0989 Other specified symptoms and signs involving the circulatory and respiratory systems: Secondary | ICD-10-CM

## 2013-09-20 DIAGNOSIS — R61 Generalized hyperhidrosis: Secondary | ICD-10-CM

## 2013-09-20 DIAGNOSIS — R0609 Other forms of dyspnea: Secondary | ICD-10-CM

## 2015-05-19 ENCOUNTER — Ambulatory Visit (HOSPITAL_COMMUNITY)
Admission: RE | Admit: 2015-05-19 | Discharge: 2015-05-19 | Disposition: A | Discharge status: Home / Self Care | Payer: 59 | Source: Ambulatory Visit | Attending: Internal Medicine | Admitting: Internal Medicine

## 2015-05-19 ENCOUNTER — Other Ambulatory Visit (HOSPITAL_COMMUNITY): Payer: Self-pay | Admitting: Internal Medicine

## 2015-05-19 DIAGNOSIS — M24831 Other specific joint derangements of right wrist, not elsewhere classified: Secondary | ICD-10-CM

## 2015-05-19 DIAGNOSIS — M25531 Pain in right wrist: Secondary | ICD-10-CM | POA: Insufficient documentation

## 2015-05-19 DIAGNOSIS — M24821 Other specific joint derangements of right elbow, not elsewhere classified: Secondary | ICD-10-CM

## 2015-05-19 DIAGNOSIS — M79621 Pain in right upper arm: Secondary | ICD-10-CM | POA: Insufficient documentation

## 2015-06-02 ENCOUNTER — Encounter: Payer: Self-pay | Admitting: *Deleted

## 2015-06-17 IMAGING — CR DG CHEST 2V
2 series · 2 of 2 positions shown · non-contrast
Comparison: None.

CLINICAL DATA: Chest pain

EXAM:
CHEST  2 VIEW

[view not recorded (1 of 2)]
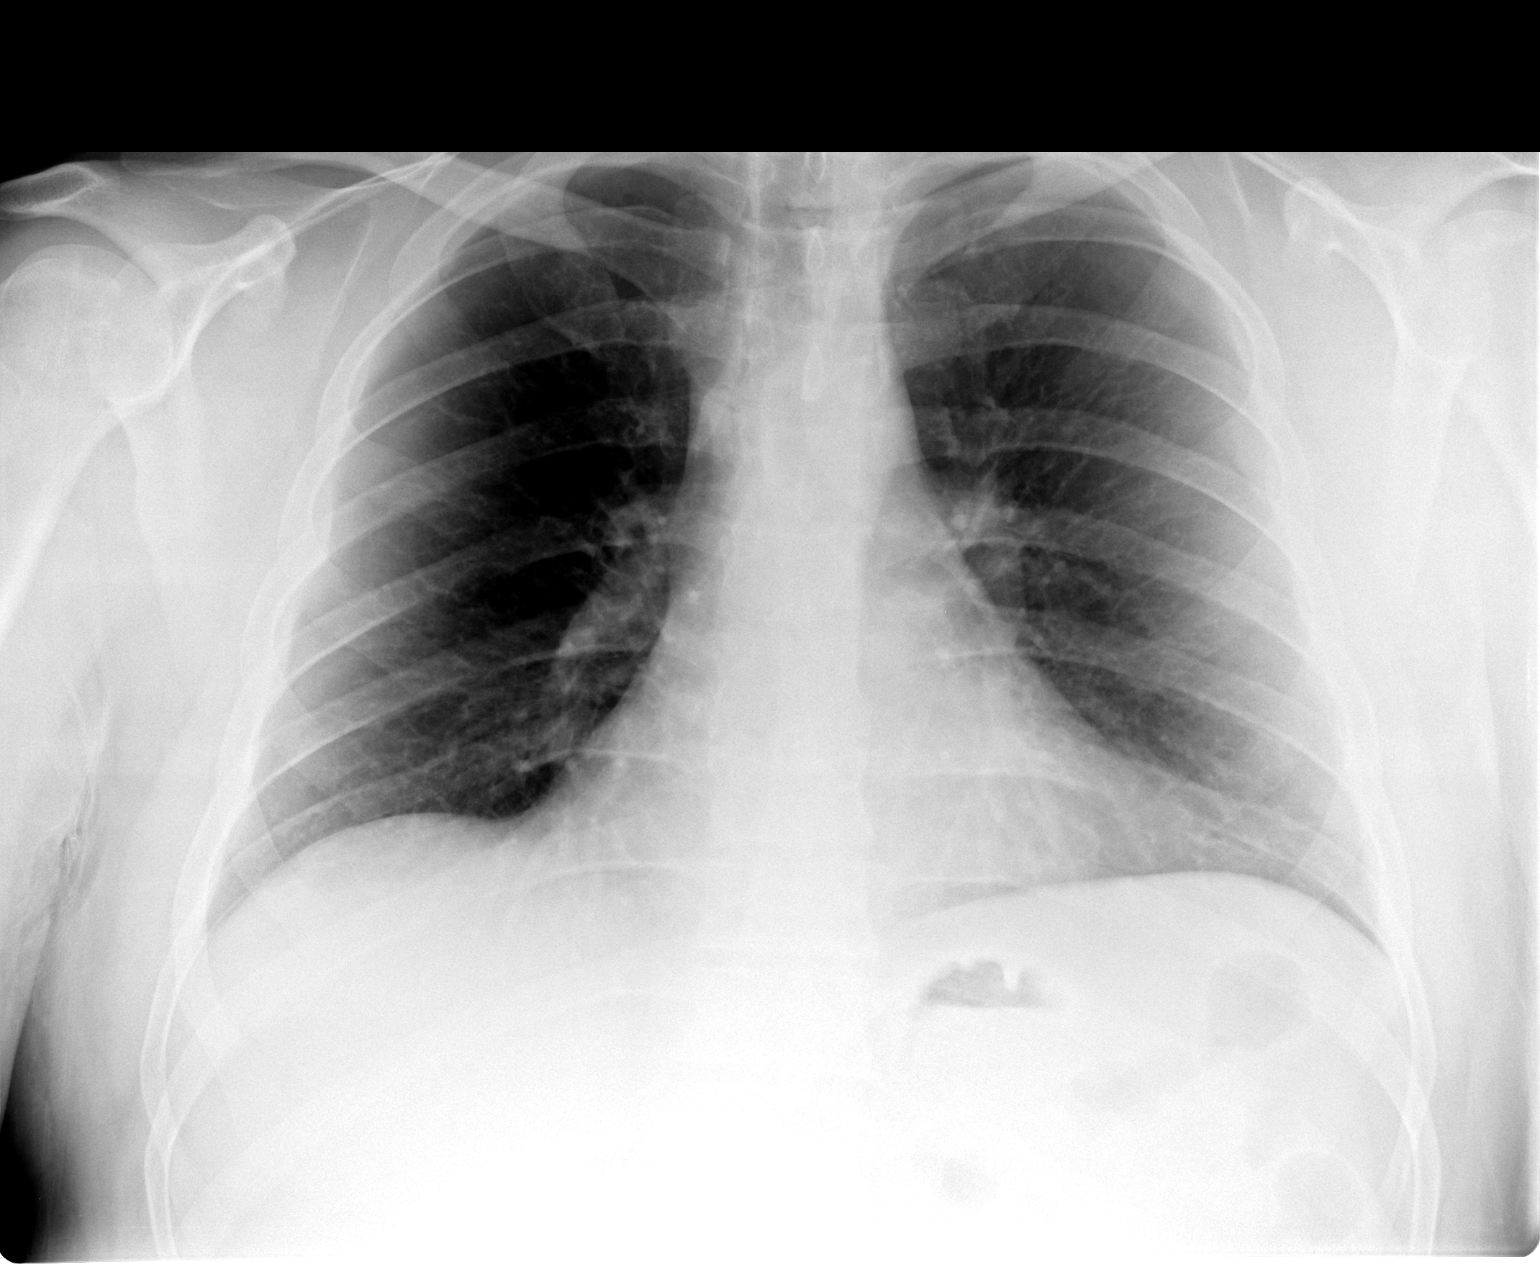

[view not recorded (2 of 2)]
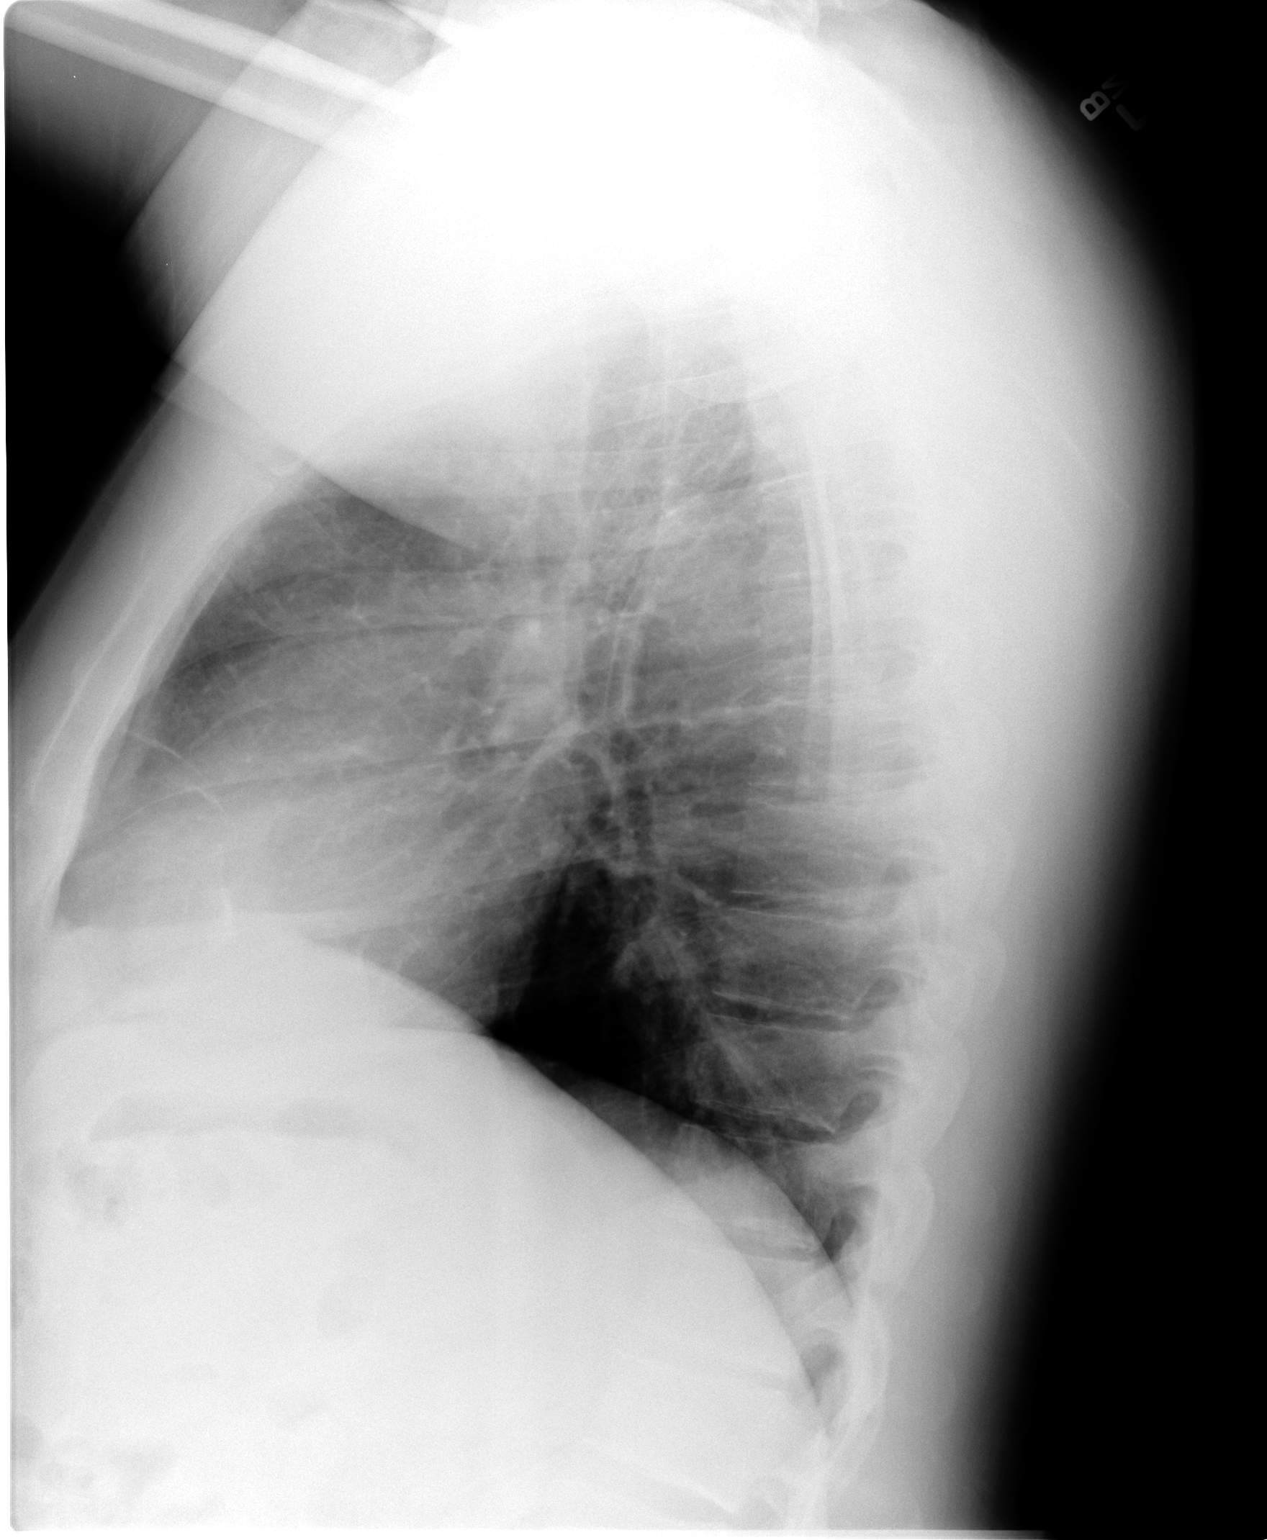

[2 of 2 positions shown; findings below may reference images not displayed]

FINDINGS: No edema or consolidation. There is mild subsegmental atelectasis in
the right middle lobe, better seen on the lateral view. . Heart size
and pulmonary vascularity are normal. No adenopathy. No
pneumothorax. No bone lesions. There is postoperative change in the
lower cervical spine.
IMPRESSION: No edema or consolidation. Mild right middle lobe atelectatic
change.

## 2016-07-07 DIAGNOSIS — M25572 Pain in left ankle and joints of left foot: Secondary | ICD-10-CM | POA: Diagnosis not present

## 2016-07-11 DIAGNOSIS — H5213 Myopia, bilateral: Secondary | ICD-10-CM | POA: Diagnosis not present

## 2016-07-11 DIAGNOSIS — H52223 Regular astigmatism, bilateral: Secondary | ICD-10-CM | POA: Diagnosis not present

## 2016-07-18 DIAGNOSIS — J069 Acute upper respiratory infection, unspecified: Secondary | ICD-10-CM | POA: Diagnosis not present

## 2016-07-20 DIAGNOSIS — J0191 Acute recurrent sinusitis, unspecified: Secondary | ICD-10-CM | POA: Diagnosis not present

## 2016-08-31 DIAGNOSIS — E782 Mixed hyperlipidemia: Secondary | ICD-10-CM | POA: Diagnosis not present

## 2018-02-24 ENCOUNTER — Encounter (HOSPITAL_COMMUNITY): Payer: Self-pay | Admitting: Emergency Medicine

## 2018-02-24 ENCOUNTER — Other Ambulatory Visit: Payer: Self-pay

## 2018-02-24 ENCOUNTER — Emergency Department (HOSPITAL_COMMUNITY): Payer: 59

## 2018-02-24 ENCOUNTER — Emergency Department (HOSPITAL_COMMUNITY)
Admission: EM | Admit: 2018-02-24 | Discharge: 2018-02-24 | Disposition: A | Discharge status: Home / Self Care | Payer: 59 | Attending: Emergency Medicine | Admitting: Emergency Medicine

## 2018-02-24 DIAGNOSIS — R1084 Generalized abdominal pain: Secondary | ICD-10-CM | POA: Diagnosis not present

## 2018-02-24 DIAGNOSIS — N200 Calculus of kidney: Secondary | ICD-10-CM | POA: Insufficient documentation

## 2018-02-24 DIAGNOSIS — Z87891 Personal history of nicotine dependence: Secondary | ICD-10-CM | POA: Insufficient documentation

## 2018-02-24 DIAGNOSIS — N132 Hydronephrosis with renal and ureteral calculous obstruction: Secondary | ICD-10-CM | POA: Diagnosis not present

## 2018-02-24 LAB — CBC WITH DIFFERENTIAL/PLATELET
Abs Immature Granulocytes: 0.03 10*3/uL (ref 0.00–0.07)
BASOS ABS: 0 10*3/uL (ref 0.0–0.1)
Basophils Relative: 0 %
EOS ABS: 0.1 10*3/uL (ref 0.0–0.5)
EOS PCT: 1 %
HEMATOCRIT: 45.2 % (ref 39.0–52.0)
HEMOGLOBIN: 14.7 g/dL (ref 13.0–17.0)
Immature Granulocytes: 0 %
LYMPHS ABS: 2.5 10*3/uL (ref 0.7–4.0)
LYMPHS PCT: 27 %
MCH: 28.3 pg (ref 26.0–34.0)
MCHC: 32.5 g/dL (ref 30.0–36.0)
MCV: 86.9 fL (ref 80.0–100.0)
MONO ABS: 0.6 10*3/uL (ref 0.1–1.0)
Monocytes Relative: 7 %
NRBC: 0 % (ref 0.0–0.2)
Neutro Abs: 5.8 10*3/uL (ref 1.7–7.7)
Neutrophils Relative %: 65 %
Platelets: 244 10*3/uL (ref 150–400)
RBC: 5.2 MIL/uL (ref 4.22–5.81)
RDW: 12.9 % (ref 11.5–15.5)
WBC: 9 10*3/uL (ref 4.0–10.5)

## 2018-02-24 LAB — URINALYSIS, ROUTINE W REFLEX MICROSCOPIC
BILIRUBIN URINE: NEGATIVE
Bacteria, UA: NONE SEEN
Glucose, UA: NEGATIVE mg/dL
Ketones, ur: NEGATIVE mg/dL
Leukocytes, UA: NEGATIVE
Nitrite: NEGATIVE
PH: 5 (ref 5.0–8.0)
Protein, ur: 30 mg/dL — AB
Specific Gravity, Urine: 1.02 (ref 1.005–1.030)

## 2018-02-24 LAB — COMPREHENSIVE METABOLIC PANEL
ALBUMIN: 4.5 g/dL (ref 3.5–5.0)
ALK PHOS: 59 U/L (ref 38–126)
ALT: 32 U/L (ref 0–44)
ANION GAP: 7 (ref 5–15)
AST: 24 U/L (ref 15–41)
BILIRUBIN TOTAL: 0.7 mg/dL (ref 0.3–1.2)
BUN: 11 mg/dL (ref 6–20)
CO2: 27 mmol/L (ref 22–32)
Calcium: 9.1 mg/dL (ref 8.9–10.3)
Chloride: 105 mmol/L (ref 98–111)
Creatinine, Ser: 1.08 mg/dL (ref 0.61–1.24)
GLUCOSE: 124 mg/dL — AB (ref 70–99)
POTASSIUM: 3.7 mmol/L (ref 3.5–5.1)
Sodium: 139 mmol/L (ref 135–145)
TOTAL PROTEIN: 7.8 g/dL (ref 6.5–8.1)

## 2018-02-24 MED ORDER — ONDANSETRON HCL 4 MG/2ML IJ SOLN
4.0000 mg | Freq: Once | INTRAMUSCULAR | Status: AC
  Administered 2018-02-24: 4 mg via INTRAVENOUS
  Filled 2018-02-24: qty 2

## 2018-02-24 MED ORDER — IBUPROFEN 400 MG PO TABS: 400.0000 mg | ORAL_TABLET | Freq: Three times a day (TID) | ORAL | 0 refills | Status: AC

## 2018-02-24 MED ORDER — SODIUM CHLORIDE 0.9 % IV BOLUS
1000.0000 mL | Freq: Once | INTRAVENOUS | Status: AC
  Administered 2018-02-24: 1000 mL via INTRAVENOUS

## 2018-02-24 MED ORDER — HYDROCODONE-ACETAMINOPHEN 5-325 MG PO TABS: 1.0000 | ORAL_TABLET | Freq: Four times a day (QID) | ORAL | 0 refills | Status: DC | PRN

## 2018-02-24 MED ORDER — ONDANSETRON 4 MG PO TBDP: 4.0000 mg | ORAL_TABLET | Freq: Three times a day (TID) | ORAL | 0 refills | Status: DC | PRN

## 2018-02-24 MED ORDER — HYDROMORPHONE HCL 1 MG/ML IJ SOLN
1.0000 mg | Freq: Once | INTRAMUSCULAR | Status: AC
Start: 2018-02-24 — End: 2018-02-24
  Administered 2018-02-24: 1 mg via INTRAVENOUS
  Filled 2018-02-24: qty 1

## 2018-02-24 MED ORDER — KETOROLAC TROMETHAMINE 30 MG/ML IJ SOLN
15.0000 mg | Freq: Once | INTRAMUSCULAR | Status: AC
  Administered 2018-02-24: 15 mg via INTRAVENOUS
  Filled 2018-02-24: qty 1

## 2018-02-24 NOTE — ED Notes (Signed)
Pt vomiting and states pain has increased, EDP notified

## 2018-02-24 NOTE — ED Triage Notes (Signed)
Patient c/o right flank pain that radiates into right lower abd and back. Per patient started approx 1 hour ago. Per patient vomited x1. Denies any nausea now. Denies any fevers or urinary symptoms. Patient reports urge to have BM but unable to.

## 2018-02-24 NOTE — ED Provider Notes (Signed)
[facility] EMERGENCY DEPARTMENT Provider Note   CSN: [id number] Arrival date & time: 02/24/18  1521     History   Chief Complaint Chief Complaint  Patient presents with  . Flank Pain    HPI Shane Hooper is a 42 y.o. male.  HPI  Patient presents with concern of right flank pain. Pain is in the right flank, right mid lower abdomen. Onset was sudden about 1 hour prior to ED arrival. Since that time the pain is been certain severe persistent, nonradiating. Pain is characteristically sharp. There is associated nausea, and he had one episode of emesis. No fever, no chills. No dysuria. No medication taken for pain relief.  Past Medical History:  Diagnosis Date  . No pertinent past medical history     Patient Active Problem List   Diagnosis Date Noted  . Lumbar herniated disc 10/20/2011    Past Surgical History:  Procedure Laterality Date  . ANKLE SURGERY     for break-steel plate put in  . CERVICAL DISC SURGERY     2008  . LUMBAR LAMINECTOMY/DECOMPRESSION MICRODISCECTOMY  10/20/2011   Procedure: LUMBAR LAMINECTOMY/DECOMPRESSION MICRODISCECTOMY 1 LEVEL;  Surgeon: Cristi Loron, MD;  Location: [facility];  Service: Neurosurgery;  Laterality: Left;  LEFT Lumbar three-four diskectomy  . MOUTH SURGERY          Home Medications    Prior to Admission medications   Not on File    Family History History reviewed. No pertinent family history.  Social History Social History   Tobacco Use  . Smoking status: Former Smoker    Packs/day: 1.00    Years: 10.00    Pack years: 10.00    Types: Cigarettes    Last attempt to quit: 10/18/2003    Years since quitting: 14.3  . Smokeless tobacco: Current User    Types: Chew  Substance Use Topics  . Alcohol use: Yes    Alcohol/week: 14.0 standard drinks    Types: 14 Cans of beer per week  . Drug use: No     Allergies   Patient has no known allergies.   Review of Systems Review of Systems    Constitutional:       Per HPI, otherwise negative  HENT:       Per HPI, otherwise negative  Respiratory:       Per HPI, otherwise negative  Cardiovascular:       Per HPI, otherwise negative  Gastrointestinal: Positive for nausea and vomiting.  Endocrine:       Negative aside from HPI  Genitourinary:       Neg aside from HPI   Musculoskeletal:       Per HPI, otherwise negative  Skin: Negative.   Neurological: Negative for syncope.     Physical Exam Updated Vital Signs BP (!) 145/97 (BP Location: Right Arm)   Pulse 62   Temp (!) 97.5 F (36.4 C) (Oral)   Resp 18   Ht 6\' 2"  (1.88 m)   Wt 115.7 kg   SpO2 99%   BMI 32.74 kg/m   Physical Exam  Constitutional: He is oriented to person, place, and time. He appears well-developed. No distress.  HENT:  Head: Normocephalic and atraumatic.  Eyes: Conjunctivae and EOM are normal.  Cardiovascular: Normal rate and regular rhythm.  Pulmonary/Chest: Effort normal. No stridor. No respiratory distress.  Abdominal: He exhibits no distension. There is no tenderness at McBurney's point and negative Murphy's sign.    Musculoskeletal: He exhibits  no edema.  Neurological: He is alert and oriented to person, place, and time.  Skin: Skin is warm and dry.  Psychiatric: He has a normal mood and affect.  Nursing note and vitals reviewed.    ED Treatments / Results  Labs (all labs ordered are listed, but only abnormal results are displayed) Labs Reviewed  URINALYSIS, ROUTINE W REFLEX MICROSCOPIC - Abnormal; Notable for the following components:      Result Value   APPearance HAZY (*)    Hgb urine dipstick LARGE (*)    Protein, ur 30 (*)    RBC / HPF >50 (*)    All other components within normal limits  COMPREHENSIVE METABOLIC PANEL - Abnormal; Notable for the following components:   Glucose, Bld 124 (*)    All other components within normal limits  CBC WITH DIFFERENTIAL/PLATELET     Radiology Ct Renal Stone Study  Result  Date: 02/24/2018 CLINICAL DATA:  Right flank pain. EXAM: CT ABDOMEN AND PELVIS WITHOUT CONTRAST TECHNIQUE: Multidetector CT imaging of the abdomen and pelvis was performed following the standard protocol without IV contrast. COMPARISON:  None. FINDINGS: Lower chest: No acute abnormality. Hepatobiliary: No focal liver abnormality is seen. No gallstones, gallbladder wall thickening, or biliary dilatation. Pancreas: Unremarkable. No pancreatic ductal dilatation or surrounding inflammatory changes. Spleen: Normal in size without focal abnormality. Adrenals/Urinary Tract: Adrenal glands are normal. There is a 3.5 mm stone in the proximal right ureter resulting in mild right perinephric stranding and mild hydronephrosis. The kidneys, ureters, and bladder are otherwise normal. Stomach/Bowel: The stomach and small bowel are normal. The colon and appendix are unremarkable. Vascular/Lymphatic: Mild atherosclerosis in the distal abdominal aorta and iliac vessels. No adenopathy. No aneurysm. Reproductive: Prostate is unremarkable. Other: No abdominal wall hernia or abnormality. No abdominopelvic ascites. Musculoskeletal: No acute or significant osseous findings. IMPRESSION: 1. There is a 3.5 mm stone in the proximal right ureter resulting in mild hydronephrosis and perinephric stranding. 2. Mild atherosclerosis in the distal abdominal aorta and iliac vessels. 3. No other abnormalities. Electronically Signed   By: Gerome Sam III M.D   On: 02/24/2018 17:34    Procedures Procedures (including critical care time)  Medications Ordered in ED Medications  sodium chloride 0.9 % bolus 1,000 mL (0 mLs Intravenous Stopped 02/24/18 1642)  ketorolac (TORADOL) 30 MG/ML injection 15 mg (15 mg Intravenous Given 02/24/18 1558)  ondansetron (ZOFRAN) injection 4 mg (4 mg Intravenous Given 02/24/18 1558)  HYDROmorphone (DILAUDID) injection 1 mg (1 mg Intravenous Given 02/24/18 1638)  ondansetron (ZOFRAN) injection 4 mg (4 mg  Intravenous Given 02/24/18 1638)     Initial Impression / Assessment and Plan / ED Course  I have reviewed the triage vital signs and the nursing notes.  Pertinent labs & imaging results that were available during my care of the patient were reviewed by me and considered in my medical decision making (see chart for details).     6:03 PM Patient awake and alert no active vomiting, labs reviewed, discussed with the patient, his wife, and another family member. CT also reviewed, notable for 3.5 mm stone, without obstruction. No evidence for infection either. With improvement here clinically, no evidence for aforementioned obstruction or infection, the patient was discharged in stable condition to follow-up with urology.  Final Clinical Impressions(s) / ED Diagnoses   Final diagnoses:  Nephrolithiasis    ED Discharge Orders         Ordered    ondansetron (ZOFRAN ODT) 4 MG disintegrating tablet  Every 8 hours PRN     02/24/18 1805    HYDROcodone-acetaminophen (NORCO/VICODIN) 5-325 MG tablet  Every 6 hours PRN     02/24/18 1805    ibuprofen (ADVIL,MOTRIN) 400 MG tablet  3 times daily     02/24/18 1805           Gerhard Munch, MD 02/24/18 1805

## 2018-03-09 DIAGNOSIS — H52223 Regular astigmatism, bilateral: Secondary | ICD-10-CM | POA: Diagnosis not present

## 2018-03-09 DIAGNOSIS — H5213 Myopia, bilateral: Secondary | ICD-10-CM | POA: Diagnosis not present

## 2018-03-28 DIAGNOSIS — M25521 Pain in right elbow: Secondary | ICD-10-CM | POA: Diagnosis not present

## 2018-04-02 DIAGNOSIS — M7021 Olecranon bursitis, right elbow: Secondary | ICD-10-CM | POA: Diagnosis not present

## 2018-05-25 DIAGNOSIS — N10 Acute tubulo-interstitial nephritis: Secondary | ICD-10-CM | POA: Diagnosis not present

## 2018-05-25 DIAGNOSIS — K5900 Constipation, unspecified: Secondary | ICD-10-CM | POA: Diagnosis not present

## 2018-05-25 DIAGNOSIS — R300 Dysuria: Secondary | ICD-10-CM | POA: Diagnosis not present

## 2018-05-26 DIAGNOSIS — N10 Acute tubulo-interstitial nephritis: Secondary | ICD-10-CM | POA: Diagnosis not present

## 2018-05-26 DIAGNOSIS — R300 Dysuria: Secondary | ICD-10-CM | POA: Diagnosis not present

## 2018-05-26 DIAGNOSIS — K5900 Constipation, unspecified: Secondary | ICD-10-CM | POA: Diagnosis not present

## 2018-09-08 DIAGNOSIS — N390 Urinary tract infection, site not specified: Secondary | ICD-10-CM | POA: Diagnosis not present

## 2018-09-08 DIAGNOSIS — N200 Calculus of kidney: Secondary | ICD-10-CM | POA: Diagnosis not present

## 2018-09-10 DIAGNOSIS — N201 Calculus of ureter: Secondary | ICD-10-CM | POA: Diagnosis not present

## 2023-06-01 ENCOUNTER — Telehealth: Payer: Self-pay | Admitting: Family Medicine

## 2023-06-01 NOTE — Telephone Encounter (Signed)
 This person's sister is an established patient of Dr. Sharlie Days. He is inquiring if he can establish care with Dr. Ester Helms also.

## 2023-06-01 NOTE — Telephone Encounter (Signed)
 Spoke with Shane Hooper and he states his brother in-law is Shane Hooper.  Advised Shane Hooper Dr.Greene is not accepting patient but would send this. I did let him know of new doctor we are getting in March and he is okay with scheduling with him also.  Please advise

## 2023-06-01 NOTE — Telephone Encounter (Signed)
 Copied from CRM [id number]. Topic: Appointments - Scheduling Inquiry for Clinic >> Jun 01, 2023 12:23 PM [facility] ORN wrote: Reason for CRM: patient is wanting to schedule a new patient appt and establish care with Dr. Levora, he was recommended by his sister who is a current patient of Dr. Valorie. I let the patient know that he is not accepting new patient's at this time and that I would send the request to see if he would accept him.  Please call patient back at [phone]

## 2023-06-01 NOTE — Telephone Encounter (Signed)
 It looks like his brother-in-law is a patient of [facility].  Who is his sister?  If she is a current patient of mine I typically will make an exception for family members, but please clarify her name, and if I am her PCP.

## 2023-06-02 NOTE — Telephone Encounter (Signed)
 Pt is okay with establishing with Dr. Gayl Katos, will reach back out once schedule is available

## 2023-06-02 NOTE — Telephone Encounter (Signed)
 Sorry, previous message should have read " unless I missed read the chart".

## 2023-06-02 NOTE — Telephone Encounter (Signed)
 Spoke to patient and his sister is [phone]. She currently is not a patient of yours. Looks like it's the brother-in-law that is.

## 2023-06-02 NOTE — Telephone Encounter (Signed)
 Once I misread the chart his brother-in-law is a patient of [facility].   Would recommend establishing with any open provider or can establish with our new physician in March.  Thanks.

## 2024-01-18 ENCOUNTER — Emergency Department (HOSPITAL_COMMUNITY)

## 2024-01-18 ENCOUNTER — Other Ambulatory Visit: Payer: Self-pay

## 2024-01-18 ENCOUNTER — Emergency Department (HOSPITAL_COMMUNITY)
Admission: EM | Admit: 2024-01-18 | Discharge: 2024-01-19 | Disposition: A | Discharge status: Home / Self Care | Attending: Emergency Medicine | Admitting: Emergency Medicine

## 2024-01-18 ENCOUNTER — Encounter (HOSPITAL_COMMUNITY): Payer: Self-pay | Admitting: Emergency Medicine

## 2024-01-18 DIAGNOSIS — F29 Unspecified psychosis not due to a substance or known physiological condition: Secondary | ICD-10-CM | POA: Insufficient documentation

## 2024-01-18 DIAGNOSIS — F4321 Adjustment disorder with depressed mood: Secondary | ICD-10-CM

## 2024-01-18 DIAGNOSIS — R45851 Suicidal ideations: Secondary | ICD-10-CM | POA: Insufficient documentation

## 2024-01-18 DIAGNOSIS — S60416A Abrasion of right little finger, initial encounter: Secondary | ICD-10-CM | POA: Insufficient documentation

## 2024-01-18 DIAGNOSIS — S60221A Contusion of right hand, initial encounter: Secondary | ICD-10-CM | POA: Diagnosis not present

## 2024-01-18 DIAGNOSIS — S6991XA Unspecified injury of right wrist, hand and finger(s), initial encounter: Secondary | ICD-10-CM | POA: Diagnosis present

## 2024-01-18 DIAGNOSIS — F32A Depression, unspecified: Secondary | ICD-10-CM

## 2024-01-18 DIAGNOSIS — X838XXA Intentional self-harm by other specified means, initial encounter: Secondary | ICD-10-CM | POA: Insufficient documentation

## 2024-01-18 DIAGNOSIS — F329 Major depressive disorder, single episode, unspecified: Secondary | ICD-10-CM | POA: Insufficient documentation

## 2024-01-18 DIAGNOSIS — F4320 Adjustment disorder, unspecified: Secondary | ICD-10-CM

## 2024-01-18 DIAGNOSIS — Y904 Blood alcohol level of 80-99 mg/100 ml: Secondary | ICD-10-CM | POA: Insufficient documentation

## 2024-01-18 DIAGNOSIS — Z79899 Other long term (current) drug therapy: Secondary | ICD-10-CM | POA: Diagnosis not present

## 2024-01-18 DIAGNOSIS — F4381 Prolonged grief disorder: Secondary | ICD-10-CM | POA: Diagnosis not present

## 2024-01-18 LAB — CBC WITH DIFFERENTIAL/PLATELET
Abs Immature Granulocytes: 0.03 K/uL (ref 0.00–0.07)
Basophils Absolute: 0 K/uL (ref 0.0–0.1)
Basophils Relative: 1 %
Eosinophils Absolute: 0.1 K/uL (ref 0.0–0.5)
Eosinophils Relative: 2 %
HCT: 45 % (ref 39.0–52.0)
Hemoglobin: 15.7 g/dL (ref 13.0–17.0)
Immature Granulocytes: 0 %
Lymphocytes Relative: 36 %
Lymphs Abs: 2.6 K/uL (ref 0.7–4.0)
MCH: 30.4 pg (ref 26.0–34.0)
MCHC: 34.9 g/dL (ref 30.0–36.0)
MCV: 87 fL (ref 80.0–100.0)
Monocytes Absolute: 0.6 K/uL (ref 0.1–1.0)
Monocytes Relative: 8 %
Neutro Abs: 3.9 K/uL (ref 1.7–7.7)
Neutrophils Relative %: 53 %
Platelets: 239 K/uL (ref 150–400)
RBC: 5.17 MIL/uL (ref 4.22–5.81)
RDW: 13.2 % (ref 11.5–15.5)
WBC: 7.3 K/uL (ref 4.0–10.5)
nRBC: 0 % (ref 0.0–0.2)

## 2024-01-18 LAB — COMPREHENSIVE METABOLIC PANEL WITH GFR
ALT: 39 U/L (ref 0–44)
AST: 27 U/L (ref 15–41)
Albumin: 4.3 g/dL (ref 3.5–5.0)
Alkaline Phosphatase: 61 U/L (ref 38–126)
Anion gap: 13 (ref 5–15)
BUN: 11 mg/dL (ref 6–20)
CO2: 23 mmol/L (ref 22–32)
Calcium: 9.2 mg/dL (ref 8.9–10.3)
Chloride: 103 mmol/L (ref 98–111)
Creatinine, Ser: 1.06 mg/dL (ref 0.61–1.24)
GFR, Estimated: >60 mL/min (ref >60)
Glucose, Bld: 104 mg/dL — ABNORMAL HIGH (ref 70–99)
Potassium: 3.9 mmol/L (ref 3.5–5.1)
Sodium: 139 mmol/L (ref 135–145)
Total Bilirubin: 0.7 mg/dL (ref 0.0–1.2)
Total Protein: 7.6 g/dL (ref 6.5–8.1)

## 2024-01-18 LAB — ETHANOL: Alcohol, Ethyl (B): 87 mg/dL — ABNORMAL HIGH (ref <15)

## 2024-01-18 NOTE — ED Notes (Signed)
 ED Provider at bedside.

## 2024-01-18 NOTE — ED Provider Notes (Signed)
 Shane Hooper EMERGENCY DEPARTMENT AT [facility]  Provider Note  CSN: [id number] Arrival date & time: 01/18/24 2233  History Chief Complaint  Patient presents with   Suicidal    Shane Hooper is a 48 y.o. male with no significant PMH brought by family for SI. He reports he has never properly processed his mother's death from earlier this year and things came to a head today with an emotional outburst, punching a wall and threatening self harm with his personal firearm. The firearm was removed from him by family who brought him to the ED for evaluation. He has no acute medical concerns, although wife reports he recently started allopurinol. He is complaining of mild pain in R hand after punching a wall. He has not had AVH.    Home Medications Prior to Admission medications   Medication Sig Start Date End Date Taking? Authorizing Provider  HYDROcodone -acetaminophen  (NORCO/VICODIN) 5-325 MG tablet Take 1 tablet by mouth every 6 (six) hours as needed for severe pain. 02/24/18   Garrick Charleston, MD  ondansetron  (ZOFRAN  ODT) 4 MG disintegrating tablet Take 1 tablet (4 mg total) by mouth every 8 (eight) hours as needed for nausea or vomiting. 02/24/18   Garrick Charleston, MD     Allergies    Patient has no known allergies.   Review of Systems   Review of Systems Please see HPI for pertinent positives and negatives  Physical Exam BP (!) 142/104 (BP Location: Right Arm)   Pulse 87   Temp 98.2 F (36.8 C) (Oral)   Resp 18   Ht 6' 2 (1.88 m)   Wt 120.2 kg   SpO2 94%   BMI 34.02 kg/m   Physical Exam Vitals and nursing note reviewed.  Constitutional:      Appearance: Normal appearance.  HENT:     Head: Normocephalic and atraumatic.     Nose: Nose normal.     Mouth/Throat:     Mouth: Mucous membranes are moist.  Eyes:     Extraocular Movements: Extraocular movements intact.     Conjunctiva/sclera: Conjunctivae normal.  Cardiovascular:     Rate and Rhythm: Normal  rate.  Pulmonary:     Effort: Pulmonary effort is normal.     Breath sounds: Normal breath sounds.  Abdominal:     General: Abdomen is flat.     Palpations: Abdomen is soft.     Tenderness: There is no abdominal tenderness.  Musculoskeletal:        General: Swelling and tenderness (R 3rd and 4th MCP joints) present. No deformity. Normal range of motion.     Cervical back: Neck supple.  Skin:    General: Skin is warm and dry.     Comments: Abrasion over R 5th MCP joint  Neurological:     General: No focal deficit present.     Mental Status: He is alert.  Psychiatric:        Mood and Affect: Mood normal.        Behavior: Behavior normal.     ED Results / Procedures / Treatments   EKG None  Procedures Procedures  Medications Ordered in the ED Medications - No data to display  Initial Impression and Plan  Patient here voluntarily with family for SI. Overall calm and cooperative but expresses frustration with being asked to wear purple scrubs and being placed in a hall bed. He understands this is protocol and that we will attempt to get him into a room if possible.  Will check screening labs and XR of hand. Plan TTS evaluation when medically clear.   ED Course       MDM Rules/Calculators/A&P Medical Decision Making Amount and/or Complexity of Data Reviewed Radiology: ordered.     Final Clinical Impression(s) / ED Diagnoses Final diagnoses:  None    Rx / DC Orders ED Discharge Orders     None

## 2024-01-18 NOTE — ED Notes (Signed)
 Patient  in burgundy scrubs, being wanded by security.

## 2024-01-18 NOTE — ED Notes (Signed)
 Patient transported to X-ray

## 2024-01-18 NOTE — ED Notes (Signed)
 Pt in restroom changing into maroon scrubs

## 2024-01-18 NOTE — ED Triage Notes (Addendum)
 Brought in with family who states that patient is having a hard time dealing with the recent passing of his mother. C/o having thought of harming himself with a gun. Wife states that this gun was taken and he no longer has access to it.  Wife states he hurt his hand. Patient states he punched a wall. Denies pain. Has some blood on knuckle of 5th finger, not actively bleeding.

## 2024-01-19 ENCOUNTER — Encounter (HOSPITAL_COMMUNITY): Payer: Self-pay | Admitting: Psychiatric/Mental Health

## 2024-01-19 DIAGNOSIS — F4321 Adjustment disorder with depressed mood: Secondary | ICD-10-CM

## 2024-01-19 DIAGNOSIS — F32A Depression, unspecified: Secondary | ICD-10-CM | POA: Diagnosis not present

## 2024-01-19 LAB — RAPID URINE DRUG SCREEN, HOSP PERFORMED
Amphetamines: NOT DETECTED
Barbiturates: NOT DETECTED
Benzodiazepines: NOT DETECTED
Cocaine: NOT DETECTED
Opiates: NOT DETECTED
Tetrahydrocannabinol: NOT DETECTED

## 2024-01-19 MED ORDER — IBUPROFEN 400 MG PO TABS
600.0000 mg | ORAL_TABLET | Freq: Once | ORAL | Status: AC
  Administered 2024-01-19: 600 mg via ORAL
  Filled 2024-01-19: qty 2

## 2024-01-19 NOTE — ED Notes (Signed)
 Pt/family received d/c paperwork at this time. After going over the paperwork any questions, comments, or concerns were answered to the best of this nurse's knowledge. The pt/family verbally acknowledged the teachings/instructions.

## 2024-01-19 NOTE — Consult Note (Signed)
 [facility] Consult Note  Patient Name: Shane Hooper MRN: [id number] DOB: 21-Oct-1975 DATE OF Consult: 01/19/2024  PRIMARY PSYCHIATRIC DIAGNOSES  1.  Grief  2.  Depressive Disorder, Unspecified    RECOMMENDATIONS  Recommendations: Medication recommendations: Patient resistant to medication management at this time   Non-Medication/therapeutic recommendations:  -- Discussed events last night with patient and wife who feel comfortable with him returning home. Firearms have been removed from home. Wife feels patient could benefit from therapy.  -- Please provide outpatient therapy and psychiatric resources upon discharge along with crisis information/ hotlines.  -- ED return precautions   Is inpatient psychiatric hospitalization recommended for this patient? No (Explain why): No, patient does not meet criteria for IVC or inpatient admission at this time   From a psychiatric perspective, is this patient appropriate for discharge to an outpatient setting/resource or other less restrictive environment for continued care?  Yes (Explain why): Patient deemed appropriate for discharge home with family.   Follow-Up Telepsychiatry C/L services: We will sign off for now. Please re-consult our service if needed for any concerning changes in the patient's condition, discharge planning, or questions.  Communication: Treatment team members (and family members if applicable) who were involved in treatment/care discussions and planning, and with whom we spoke or engaged with via secure text/chat, include the following: ED Team via [employer]  Thank you for involving us  in the care of this patient. If you have any additional questions or concerns, please call [phone] and ask for me or the provider on-call.  TELEPSYCHIATRY ATTESTATION & CONSENT  As the provider for this telehealth consult, I attest that I verified the patient's identity using two separate identifiers, introduced myself to the  patient, provided my credentials, disclosed my location, and performed this encounter via a HIPAA-compliant, real-time, face-to-face, two-way, interactive audio and video platform and with the full consent and agreement of the patient (or guardian as applicable.)  Patient physical location: ED in [facility]. Telehealth provider physical location: home office in state of [place] .  Video start time: 0859 (Central Time) Video end time: 0914 (Central Time)  IDENTIFYING DATA  Shane Hooper is a 48 y.o. year-old male for whom a psychiatric consultation has been ordered by the primary provider. The patient was identified using two separate identifiers.  CHIEF COMPLAINT/REASON FOR CONSULT  Suicidal Ideations   HISTORY OF PRESENT ILLNESS (HPI)  The patient is a 48yo male who presented to the emergency department, accompanied by family, with concerns for suicidal ideations with plan to shoot himself with firearm. Family removed firearm and brought him to the ED for evaluation.   BAL 87 UDS Negative   Upon evaluation, patient is calm and cooperative, alert and oriented. Constricted affect. Patient states his mother passed away in 10/19/2023. States he hasn't had time to process his grief. States he feels multiple other stressors piling up over time. Feels he hit his breaking point last night following an argument with family.   Patient states he made comment that he felt he would be better off dead than alive. Felt he was a burden on his family. Denies making suicidal threats. States he never held the gun in his hand. He does admit he had the ammunition in his hand but handed it to his wife. He denies intent to shoot himself, no passive SI. Denies recent plans of suicide. No prior suicide attempts or history of SIB. He mentions he is tired of doing everything for others when they don't  appreciate or check in on him. Denies active SI/HI. No psychosis present, no symptoms indicative of mania/  hypomania. Admits to drinking 2-3 beers last night which he does nightly while he cooks dinner. Denies history of alcohol dependence and drug abuse.   Called and spoke to patient's wife, Taijon Vink [phone]. Wife confirms there was a small family argument last night. She states since patient's mother passed away he has been so upset. He is typically laid back and doesn't let things bother him. Also keeps a lot in and doesn't discuss how he is feeling. Wife states patient went outside and was walking around and was making comments about how she would be better off without him here. She denies he actually threatened suicide. She denies he ever had the gun in his possession but she does admit that he handed her the ammunition. Patient's friend who is a Emergency planning/management officer came to the house to remove the gun. Wife feels patient is safe to return home but she feels he could benefit from therapy.     PAST PSYCHIATRIC HISTORY  No prior psychiatric history   Otherwise as per HPI above.  PAST MEDICAL HISTORY  Past Medical History:  Diagnosis Date   No pertinent past medical history      HOME MEDICATIONS  Facility Ordered Medications  Medication   [COMPLETED] ibuprofen  (ADVIL ) tablet 600 mg   PTA Medications  Medication Sig   allopurinol (ZYLOPRIM) 300 MG tablet Take 300 mg by mouth daily.   colchicine 0.6 MG tablet Take 0.6 mg by mouth 2 (two) times daily.     ALLERGIES  No Known Allergies  SOCIAL & SUBSTANCE USE HISTORY  Social History   Socioeconomic History   Marital status: Married    Spouse name: Not on file   Number of children: Not on file   Years of education: Not on file   Highest education level: Not on file  Occupational History   Not on file  Tobacco Use   Smoking status: Former    Current packs/day: 0.00    Average packs/day: 1 pack/day for 10.0 years (10.0 ttl pk-yrs)    Types: Cigarettes    Start date: 10/17/1993    Quit date: 10/18/2003    Years since quitting:  20.2   Smokeless tobacco: Current    Types: Chew  Vaping Use   Vaping status: Never Used  Substance and Sexual Activity   Alcohol use: Yes    Alcohol/week: 14.0 standard drinks of alcohol    Types: 14 Cans of beer per week   Drug use: No   Sexual activity: Not on file  Other Topics Concern   Not on file  Social History Narrative   Not on file   Social Drivers of Health   Financial Resource Strain: Not on file  Food Insecurity: Not on file  Transportation Needs: Not on file  Physical Activity: Not on file  Stress: Not on file  Social Connections: Not on file   Social History   Tobacco Use  Smoking Status Former   Current packs/day: 0.00   Average packs/day: 1 pack/day for 10.0 years (10.0 ttl pk-yrs)   Types: Cigarettes   Start date: 10/17/1993   Quit date: 10/18/2003   Years since quitting: 20.2  Smokeless Tobacco Current   Types: Chew   Social History   Substance and Sexual Activity  Alcohol Use Yes   Alcohol/week: 14.0 standard drinks of alcohol   Types: 14 Cans of beer per week  Social History   Substance and Sexual Activity  Drug Use No    Additional pertinent information: lives with wife and two daughters Works at [employer]    FAMILY HISTORY  History reviewed. No pertinent family history. Family Psychiatric History (if known):  Mother- Depression    MENTAL STATUS EXAM (MSE)  Mental Status Exam: General Appearance: Well Groomed  Orientation:  Full (Time, Place, and Person)  Memory:  Immediate;   Good Recent;   Good  Concentration:  Concentration: Good  Recall:  Good  Attention  Good  Eye Contact:  Good  Speech:  Clear and Coherent  Language:  Good  Volume:  Normal  Mood: stressed  Affect:  Constricted  Thought Process:  Coherent  Thought Content:  Logical  Suicidal Thoughts:  No  Homicidal Thoughts:  No  Judgement:  Other:  limited  Insight:  Fair  Psychomotor Activity:  Normal  Akathisia:  No  Fund of Knowledge:  Good     Assets:  Communication Skills Desire for Improvement Financial Resources/Insurance Housing Physical Health Social Support Transportation Vocational/Educational  Cognition:  WNL  ADL's:  Intact  AIMS (if indicated):       VITALS  Blood pressure 112/75, pulse 68, temperature 98.2 F (36.8 C), temperature source Oral, resp. rate 18, height 6' 2 (1.88 m), weight 120.2 kg, SpO2 96%.  LABS  Admission on 01/18/2024  Component Date Value Ref Range Status   Sodium 01/18/2024 139  135 - 145 mmol/L Final   Potassium 01/18/2024 3.9  3.5 - 5.1 mmol/L Final   Chloride 01/18/2024 103  98 - 111 mmol/L Final   CO2 01/18/2024 23  22 - 32 mmol/L Final   Glucose, Bld 01/18/2024 104 (H)  70 - 99 mg/dL Final   Glucose reference range applies only to samples taken after fasting for at least 8 hours.   BUN 01/18/2024 11  6 - 20 mg/dL Final   Creatinine, Ser 01/18/2024 1.06  0.61 - 1.24 mg/dL Final   Calcium 90/74/7974 9.2  8.9 - 10.3 mg/dL Final   Total Protein 90/74/7974 7.6  6.5 - 8.1 g/dL Final   Albumin 90/74/7974 4.3  3.5 - 5.0 g/dL Final   AST 90/74/7974 27  15 - 41 U/L Final   ALT 01/18/2024 39  0 - 44 U/L Final   Alkaline Phosphatase 01/18/2024 61  38 - 126 U/L Final   Total Bilirubin 01/18/2024 0.7  0.0 - 1.2 mg/dL Final   GFR, Estimated 01/18/2024 >60  >60 mL/min Final   Comment: (NOTE) Calculated using the CKD-EPI Creatinine Equation (2021)    Anion gap 01/18/2024 13  5 - 15 Final   Performed at [facility], [address] [place], [place] [address]   Alcohol, Ethyl (B) 01/18/2024 87 (H)  <15 mg/dL Final   Comment: (NOTE) For medical purposes only. Performed at [facility], [address], [place], [place] [address]    Opiates 01/19/2024 NONE DETECTED  NONE DETECTED Final   Cocaine 01/19/2024 NONE DETECTED  NONE DETECTED Final   Benzodiazepines 01/19/2024 NONE DETECTED  NONE DETECTED Final   Amphetamines 01/19/2024 NONE DETECTED  NONE DETECTED Final   Tetrahydrocannabinol  01/19/2024 NONE DETECTED  NONE DETECTED Final   Barbiturates 01/19/2024 NONE DETECTED  NONE DETECTED Final   Comment: (NOTE) DRUG SCREEN FOR MEDICAL PURPOSES ONLY.  IF CONFIRMATION IS NEEDED FOR ANY PURPOSE, NOTIFY LAB WITHIN 5 DAYS.  LOWEST DETECTABLE LIMITS FOR URINE DRUG SCREEN Drug Class  Cutoff (ng/mL) Amphetamine and metabolites    1000 Barbiturate and metabolites    200 Benzodiazepine                 200 Opiates and metabolites        300 Cocaine and metabolites        300 THC                            50 Performed at [facility], [address], [place], [place] [address]    WBC 01/18/2024 7.3  4.0 - 10.5 K/uL Final   RBC 01/18/2024 5.17  4.22 - 5.81 MIL/uL Final   Hemoglobin 01/18/2024 15.7  13.0 - 17.0 g/dL Final   HCT 90/74/7974 45.0  39.0 - 52.0 % Final   MCV 01/18/2024 87.0  80.0 - 100.0 fL Final   MCH 01/18/2024 30.4  26.0 - 34.0 pg Final   MCHC 01/18/2024 34.9  30.0 - 36.0 g/dL Final   RDW 90/74/7974 13.2  11.5 - 15.5 % Final   Platelets 01/18/2024 239  150 - 400 K/uL Final   nRBC 01/18/2024 0.0  0.0 - 0.2 % Final   Neutrophils Relative % 01/18/2024 53  % Final   Neutro Abs 01/18/2024 3.9  1.7 - 7.7 K/uL Final   Lymphocytes Relative 01/18/2024 36  % Final   Lymphs Abs 01/18/2024 2.6  0.7 - 4.0 K/uL Final   Monocytes Relative 01/18/2024 8  % Final   Monocytes Absolute 01/18/2024 0.6  0.1 - 1.0 K/uL Final   Eosinophils Relative 01/18/2024 2  % Final   Eosinophils Absolute 01/18/2024 0.1  0.0 - 0.5 K/uL Final   Basophils Relative 01/18/2024 1  % Final   Basophils Absolute 01/18/2024 0.0  0.0 - 0.1 K/uL Final   Immature Granulocytes 01/18/2024 0  % Final   Abs Immature Granulocytes 01/18/2024 0.03  0.00 - 0.07 K/uL Final   Performed at [facility], [address] [place], [place] [address]    PSYCHIATRIC REVIEW OF SYSTEMS (ROS)  ROS: Notable for the following relevant positive findings: Review of Systems  Psychiatric/Behavioral:  Positive  for depression. Negative for hallucinations, substance abuse and suicidal ideas. The patient is nervous/anxious.     Additional findings:      Musculoskeletal: No abnormal movements observed      Gait & Station: Laying/Sitting      Pain Screening: Denies      Nutrition & Dental Concerns: No concerns at this time   RISK FORMULATION/ASSESSMENT  Is the patient experiencing any suicidal or homicidal ideations: No       Explain if yes:  Protective factors considered for safety management: access to care, family/ social support, willingness to seek treatment in community, no prior history     Risk factors/concerns considered for safety management:  Depression Recent loss Access to lethal means Male gender  Is there a safety management plan with the patient and treatment team to minimize risk factors and promote protective factors: Yes           Explain: Currently in the ED, outpatient therapy  Is crisis care placement or psychiatric hospitalization recommended: No     Based on my current evaluation and risk assessment, patient is determined at this time to be at:  Moderate Risk  *RISK ASSESSMENT Risk assessment is a dynamic process; it is possible that this patient's condition, and risk level, may change. This should be re-evaluated and managed  over time as appropriate. Please re-consult psychiatric consult services if additional assistance is needed in terms of risk assessment and management. If your team decides to discharge this patient, please advise the patient how to best access emergency psychiatric services, or to call 911, if their condition worsens or they feel unsafe in any way.   Osmar Howton E Aayush Gelpi, NP Telepsychiatry Consult Services

## 2024-01-19 NOTE — ED Notes (Signed)
Pt ambulated to bathroom to void.  Urine specimen collected.

## 2024-01-19 NOTE — ED Provider Notes (Addendum)
 Emergency Medicine Observation Re-evaluation Note  Shane Hooper is a 48 y.o. male, seen on rounds today.  Pt initially presented to the ED for complaints of Suicidal Currently, the patient is sleeping.  Physical Exam  BP (!) 142/104 (BP Location: Right Arm)   Pulse 87   Temp 98.2 F (36.8 C) (Oral)   Resp 18   Ht 6' 2 (1.88 m)   Wt 120.2 kg   SpO2 94%   BMI 34.02 kg/m  Physical Exam General: NAD Cardiac: regular rate  Lungs: equal chest rise Psych: calm  ED Course / MDM  EKG:   I have reviewed the labs performed to date as well as medications administered while in observation.  Recent changes in the last 24 hours include arrived in ER for SI.  Plan  Current plan is for TTS eval.    Francesca Elsie CROME, MD 01/19/24 0745  Patient has been evaluated by behavioral health and they have cleared him for discharge.  Reassessed patient, he denies any active suicidal thoughts, here with his wife, feel comfortable with discharge to home, and understands reasons to return to the emergency department.  Have provided resources for patient.  Follow-up as outpatient.   Francesca Elsie CROME, MD 01/19/24 1106

## 2024-01-19 NOTE — ED Notes (Signed)
 Pt awake and stating frustration with waiting for psych consult and is requesting to go home. Pt educated on the risks of leaving for his safety and notified the possibility of IVC papers being served if pt tries to leave. Pt offered PRN meds to help relax, pt denied and states I just want to go home. Dr Roselyn made aware and psych contacted to expedite TTS if possible.

## 2024-01-19 NOTE — ED Notes (Signed)
Pt having tts at this time  

## 2024-01-19 NOTE — ED Notes (Signed)
 Pt pacing in room, appears to becoming increasingly agitated. Wife at bedside.

## 2024-01-19 NOTE — BH Assessment (Signed)
 This patient has been referred to [facility] telecare for his teleassessment.  A coordinator with [facility] will reach out with a time and provider to see patient.

## 2024-01-19 NOTE — Discharge Instructions (Addendum)
 We evaluated you for feeling down.  You were evaluated by psychiatry and they feel that it is safe to go home.  We recommend that you follow-up with the behavioral health resources as an outpatient.  We have attached resources that you can call.  You can also go to the behavioral health urgent care.  If you have any worsening symptoms such as thoughts of wanting to hurt yourself, feeling unsafe at home, hallucinations, or any other concerning symptoms, please return to the emergency department.
